# Patient Record
Sex: Female | Born: 1980 | Race: Black or African American | Hispanic: No | Marital: Single | Smoking: Former smoker
Health system: Southern US, Community
[De-identification: ages and names within clinical notes are randomized; demographics above are authoritative.]

## PROBLEM LIST (undated history)

## (undated) DIAGNOSIS — E669 Obesity, unspecified: Secondary | ICD-10-CM

## (undated) DIAGNOSIS — M5416 Radiculopathy, lumbar region: Secondary | ICD-10-CM

## (undated) DIAGNOSIS — F909 Attention-deficit hyperactivity disorder, unspecified type: Secondary | ICD-10-CM

## (undated) DIAGNOSIS — J45909 Unspecified asthma, uncomplicated: Secondary | ICD-10-CM

## (undated) DIAGNOSIS — F1291 Cannabis use, unspecified, in remission: Secondary | ICD-10-CM

## (undated) DIAGNOSIS — F419 Anxiety disorder, unspecified: Secondary | ICD-10-CM

## (undated) DIAGNOSIS — D649 Anemia, unspecified: Secondary | ICD-10-CM

## (undated) DIAGNOSIS — Z8619 Personal history of other infectious and parasitic diseases: Secondary | ICD-10-CM

## (undated) DIAGNOSIS — A6000 Herpesviral infection of urogenital system, unspecified: Secondary | ICD-10-CM

## (undated) DIAGNOSIS — R079 Chest pain, unspecified: Secondary | ICD-10-CM

## (undated) DIAGNOSIS — M48061 Spinal stenosis, lumbar region without neurogenic claudication: Secondary | ICD-10-CM

## (undated) DIAGNOSIS — R0602 Shortness of breath: Secondary | ICD-10-CM

## (undated) HISTORY — PX: FOOT SURGERY: SHX648

---

## 2004-10-15 ENCOUNTER — Emergency Department: Payer: Self-pay | Admitting: General Practice

## 2004-11-07 ENCOUNTER — Emergency Department: Payer: Self-pay | Admitting: Emergency Medicine

## 2004-11-24 ENCOUNTER — Emergency Department: Payer: Self-pay | Admitting: Unknown Physician Specialty

## 2005-03-09 ENCOUNTER — Emergency Department: Payer: Self-pay | Admitting: Emergency Medicine

## 2005-03-11 ENCOUNTER — Emergency Department: Payer: Self-pay | Admitting: General Practice

## 2006-01-31 ENCOUNTER — Emergency Department: Payer: Self-pay | Admitting: Emergency Medicine

## 2006-02-20 ENCOUNTER — Emergency Department: Payer: Self-pay | Admitting: Emergency Medicine

## 2006-05-02 ENCOUNTER — Observation Stay: Payer: Self-pay | Admitting: Obstetrics and Gynecology

## 2006-05-11 ENCOUNTER — Observation Stay: Payer: Self-pay

## 2006-05-20 ENCOUNTER — Observation Stay: Payer: Self-pay

## 2006-09-04 ENCOUNTER — Emergency Department: Payer: Self-pay | Admitting: Emergency Medicine

## 2006-12-22 ENCOUNTER — Other Ambulatory Visit: Payer: Self-pay

## 2006-12-22 ENCOUNTER — Emergency Department: Payer: Self-pay | Admitting: Emergency Medicine

## 2008-01-28 DIAGNOSIS — Z91199 Patient's noncompliance with other medical treatment and regimen due to unspecified reason: Secondary | ICD-10-CM | POA: Insufficient documentation

## 2008-01-28 DIAGNOSIS — Z3009 Encounter for other general counseling and advice on contraception: Secondary | ICD-10-CM | POA: Insufficient documentation

## 2008-08-25 ENCOUNTER — Emergency Department: Payer: Self-pay | Admitting: Emergency Medicine

## 2008-12-27 ENCOUNTER — Emergency Department: Payer: Self-pay | Admitting: Emergency Medicine

## 2009-09-26 ENCOUNTER — Emergency Department: Payer: Self-pay | Admitting: Unknown Physician Specialty

## 2010-07-11 ENCOUNTER — Emergency Department: Payer: Self-pay | Admitting: Emergency Medicine

## 2010-11-02 ENCOUNTER — Emergency Department: Payer: Self-pay | Admitting: *Deleted

## 2011-06-30 ENCOUNTER — Emergency Department: Payer: Self-pay | Admitting: Unknown Physician Specialty

## 2011-06-30 LAB — BASIC METABOLIC PANEL
BUN: 18 mg/dL (ref 7–18)
Co2: 22 mmol/L (ref 21–32)
Creatinine: 0.9 mg/dL (ref 0.60–1.30)
EGFR (Non-African Amer.): >60
Glucose: 108 mg/dL — ABNORMAL HIGH (ref 65–99)
Osmolality: 287 (ref 275–301)
Potassium: 3.6 mmol/L (ref 3.5–5.1)
Sodium: 143 mmol/L (ref 136–145)

## 2011-06-30 LAB — CBC
HCT: 39.1 % (ref 35.0–47.0)
HGB: 13.1 g/dL (ref 12.0–16.0)
MCH: 31.5 pg (ref 26.0–34.0)
MCHC: 33.5 g/dL (ref 32.0–36.0)
Platelet: 207 10*3/uL (ref 150–440)
RBC: 4.16 10*6/uL (ref 3.80–5.20)

## 2011-11-16 ENCOUNTER — Emergency Department: Payer: Self-pay | Admitting: Emergency Medicine

## 2011-11-16 LAB — BASIC METABOLIC PANEL
Calcium, Total: 8.7 mg/dL (ref 8.5–10.1)
Co2: 24 mmol/L (ref 21–32)
Creatinine: 1.17 mg/dL (ref 0.60–1.30)
EGFR (African American): >60
EGFR (Non-African Amer.): >60
Glucose: 118 mg/dL — ABNORMAL HIGH (ref 65–99)
Sodium: 143 mmol/L (ref 136–145)

## 2011-11-16 LAB — CBC
HCT: 40.8 % (ref 35.0–47.0)
HGB: 13.1 g/dL (ref 12.0–16.0)
MCH: 30.4 pg (ref 26.0–34.0)
MCHC: 32.2 g/dL (ref 32.0–36.0)
Platelet: 209 10*3/uL (ref 150–440)
RBC: 4.33 10*6/uL (ref 3.80–5.20)
RDW: 12.5 % (ref 11.5–14.5)

## 2012-03-25 ENCOUNTER — Emergency Department: Payer: Self-pay | Admitting: Emergency Medicine

## 2012-04-03 ENCOUNTER — Emergency Department: Payer: Self-pay | Admitting: Emergency Medicine

## 2012-07-26 ENCOUNTER — Ambulatory Visit: Payer: Self-pay | Admitting: Unknown Physician Specialty

## 2012-07-26 DIAGNOSIS — J4520 Mild intermittent asthma, uncomplicated: Secondary | ICD-10-CM | POA: Insufficient documentation

## 2012-07-26 LAB — CBC WITH DIFFERENTIAL/PLATELET
Basophil #: 0.1 10*3/uL (ref 0.0–0.1)
Eosinophil #: 0.2 10*3/uL (ref 0.0–0.7)
Eosinophil %: 2.6 %
Lymphocyte #: 2.8 10*3/uL (ref 1.0–3.6)
MCHC: 33.3 g/dL (ref 32.0–36.0)
MCV: 94 fL (ref 80–100)
Monocyte %: 9.8 %
Neutrophil %: 51.4 %
RBC: 4.15 10*6/uL (ref 3.80–5.20)
RDW: 12.9 % (ref 11.5–14.5)

## 2012-07-26 LAB — POTASSIUM: Potassium: 3.9 mmol/L (ref 3.5–5.1)

## 2012-07-27 ENCOUNTER — Ambulatory Visit: Payer: Self-pay | Admitting: Podiatry

## 2014-08-15 NOTE — Op Note (Signed)
PATIENT NAME:  Melissa Black, Melissa Black MR#:  [id number] DATE OF BIRTH:  10/03/1980  DATE OF PROCEDURE:  07/27/2012  PREOPERATIVE DIAGNOSIS: Hammertoe with exostosis, right fifth toe.   POSTOPERATIVE DIAGNOSIS: Hammertoe with exostosis, right fifth toe.   PROCEDURE: Arthroplasty, right fifth toe.   SURGEON: Linus Galasodd Chasyn Cinque, DPM  ANESTHESIA: Local MAC.   HEMOSTASIS: Pneumatic tourniquet, right ankle, 250 mmHg.   ESTIMATED BLOOD LOSS: Minimal.   PATHOLOGY: None.   COMPLICATIONS: None apparent.   OPERATIVE INDICATIONS: This is a 34 year old female with a history of a fracture in her right fifth toe. Has developed a painful corn on the toe with bony prominence and elects for surgical intervention for removal.   DESCRIPTION OF PROCEDURE: The patient was taken to the operating room and placed on the table in the supine position. Following satisfactory sedation, the right foot was anesthetized with 5 mL of 0.5% Sensorcaine plain around the base of the fifth toe. A pneumatic tourniquet was applied at the level of the right ankle and the foot was prepped and draped in the usual sterile fashion. The foot was exsanguinated and the tourniquet inflated to 250 mmHg.   Attention was then directed to the dorsal aspect of the right fifth toe where an approximate 1.5 cm linear incision was made coursing proximal to distal over the proximal interphalangeal joint. The incision was deepened via sharp and blunt dissection down to the level of the joint where a transverse tenotomy was performed. The capsular and periosteal tissue was reflected off the head of the proximal phalanx. Using a pneumatic saw, the head was resected and removed in toto. Next, the periosteal tissues were reflected off of the lateral aspect of the middle phalanx. Using a pneumatic saw, the lateral most portion of the middle phalanx was resected. Intraoperative FluoroScan views revealed good reduction of all of the bony prominences. The wound was flushed  with copious amounts of sterile saline and closed using 4-0 Vicryl simple interrupted suture for tendon reapproximation followed by skin closure using 5-0 nylon simple interrupted sutures. Xeroform and a sterile gauze bandage was applied. The tourniquet was released and blood flow noted to return immediately to the right foot in all digits. The patient tolerated the procedure and anesthesia well and was transported to the PAC-U with vital signs stable and in good condition.  ____________________________ Linus Galasodd Satina Jerrell, DPM tc:sb D: 07/27/2012 10:44:32 ET T: 07/27/2012 11:03:49 ET JOB#: [id number]  cc: Linus Galasodd Kaysi Ourada, DPM, <Dictator> Jt Brabec DPM ELECTRONICALLY SIGNED 07/28/2012 10:55

## 2015-04-02 ENCOUNTER — Emergency Department
Admission: EM | Admit: 2015-04-02 | Discharge: 2015-04-02 | Disposition: A | Discharge status: Home / Self Care | Payer: No Typology Code available for payment source | Attending: Emergency Medicine | Admitting: Emergency Medicine

## 2015-04-02 ENCOUNTER — Encounter: Payer: Self-pay | Admitting: Emergency Medicine

## 2015-04-02 DIAGNOSIS — Y92410 Unspecified street and highway as the place of occurrence of the external cause: Secondary | ICD-10-CM | POA: Insufficient documentation

## 2015-04-02 DIAGNOSIS — Z880 Allergy status to penicillin: Secondary | ICD-10-CM | POA: Insufficient documentation

## 2015-04-02 DIAGNOSIS — Y998 Other external cause status: Secondary | ICD-10-CM | POA: Insufficient documentation

## 2015-04-02 DIAGNOSIS — S0990XA Unspecified injury of head, initial encounter: Secondary | ICD-10-CM | POA: Insufficient documentation

## 2015-04-02 DIAGNOSIS — M62838 Other muscle spasm: Secondary | ICD-10-CM

## 2015-04-02 DIAGNOSIS — Z87891 Personal history of nicotine dependence: Secondary | ICD-10-CM | POA: Diagnosis not present

## 2015-04-02 DIAGNOSIS — Y9389 Activity, other specified: Secondary | ICD-10-CM | POA: Diagnosis not present

## 2015-04-02 DIAGNOSIS — S0083XA Contusion of other part of head, initial encounter: Secondary | ICD-10-CM | POA: Insufficient documentation

## 2015-04-02 DIAGNOSIS — S39012A Strain of muscle, fascia and tendon of lower back, initial encounter: Secondary | ICD-10-CM | POA: Insufficient documentation

## 2015-04-02 DIAGNOSIS — S3992XA Unspecified injury of lower back, initial encounter: Secondary | ICD-10-CM | POA: Diagnosis present

## 2015-04-02 MED ORDER — DIAZEPAM 2 MG PO TABS: 2.0000 mg | ORAL_TABLET | Freq: Three times a day (TID) | ORAL | Status: DC | PRN

## 2015-04-02 MED ORDER — MELOXICAM 15 MG PO TABS: 15.0000 mg | ORAL_TABLET | Freq: Every day | ORAL | Status: DC
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
## 2015-09-03 ENCOUNTER — Encounter: Payer: Self-pay | Admitting: Emergency Medicine

## 2015-09-03 DIAGNOSIS — Z87891 Personal history of nicotine dependence: Secondary | ICD-10-CM | POA: Diagnosis not present

## 2015-09-03 DIAGNOSIS — R0789 Other chest pain: Secondary | ICD-10-CM | POA: Insufficient documentation

## 2015-09-03 DIAGNOSIS — J45909 Unspecified asthma, uncomplicated: Secondary | ICD-10-CM | POA: Insufficient documentation

## 2015-09-03 DIAGNOSIS — Z79899 Other long term (current) drug therapy: Secondary | ICD-10-CM | POA: Insufficient documentation

## 2015-09-03 NOTE — ED Notes (Signed)
Pt to rm 13 via EMS from home.  EMS reports pt c/o intermittent cp x 1 week, described as heaviness/cramping, located mid chest.  Pt hx asthma.  Pt NAD upon arrival, respirations equal and unlabored, skin warm and dry.

## 2015-09-04 ENCOUNTER — Emergency Department
Admission: EM | Admit: 2015-09-04 | Discharge: 2015-09-04 | Disposition: A | Discharge status: Home / Self Care | Payer: Medicaid Other | Attending: Emergency Medicine | Admitting: Emergency Medicine

## 2015-09-04 ENCOUNTER — Emergency Department: Payer: Medicaid Other

## 2015-09-04 DIAGNOSIS — R079 Chest pain, unspecified: Secondary | ICD-10-CM

## 2015-09-04 HISTORY — DX: Unspecified asthma, uncomplicated: J45.909

## 2015-09-04 LAB — BASIC METABOLIC PANEL
Anion gap: 4 — ABNORMAL LOW (ref 5–15)
BUN: 13 mg/dL (ref 6–20)
CHLORIDE: 108 mmol/L (ref 101–111)
CO2: 26 mmol/L (ref 22–32)
CREATININE: 0.77 mg/dL (ref 0.44–1.00)
Calcium: 8.9 mg/dL (ref 8.9–10.3)
Glucose, Bld: 121 mg/dL — ABNORMAL HIGH (ref 65–99)
POTASSIUM: 3.8 mmol/L (ref 3.5–5.1)
SODIUM: 138 mmol/L (ref 135–145)

## 2015-09-04 LAB — CBC
HCT: 37 % (ref 35.0–47.0)
Hemoglobin: 12.8 g/dL (ref 12.0–16.0)
MCH: 31.1 pg (ref 26.0–34.0)
MCHC: 34.7 g/dL (ref 32.0–36.0)
MCV: 89.8 fL (ref 80.0–100.0)
PLATELETS: 243 10*3/uL (ref 150–440)
RBC: 4.12 MIL/uL (ref 3.80–5.20)
RDW: 13.1 % (ref 11.5–14.5)
WBC: 8.7 10*3/uL (ref 3.6–11.0)

## 2015-09-04 LAB — FIBRIN DERIVATIVES D-DIMER (ARMC ONLY): FIBRIN DERIVATIVES D-DIMER (ARMC): 220 (ref 0–499)

## 2015-09-04 LAB — TROPONIN I: Troponin I: <0.03 ng/mL (ref <0.031)

## 2015-09-04 MED ORDER — LORAZEPAM 1 MG PO TABS
1.0000 mg | ORAL_TABLET | Freq: Once | ORAL | Status: AC
  Administered 2015-09-04: 1 mg via ORAL

## 2015-09-04 MED ORDER — LORAZEPAM 1 MG PO TABS
ORAL_TABLET | ORAL | Status: AC
  Administered 2015-09-04: 1 mg via ORAL
  Filled 2015-09-04: qty 1

## 2015-09-04 NOTE — Discharge Instructions (Signed)

## 2015-09-04 NOTE — ED Notes (Signed)
Pt reports SOB to tech who informed this nurse.  Pt assessed, lung sounds clear, normal rate of 20, pt c/o heaviness 6/10, states it was worse a couple minutes ago.  VS WDL.  Dr. Manson PasseyBrown notified

## 2015-09-04 NOTE — ED Provider Notes (Signed)
[facility] Emergency Department Provider Note  ____________________________________________  Time seen: 11:55 PM  I have reviewed the triage vital signs and the nursing notes.   HISTORY  Chief Complaint Chest Pain      HPI Melissa Black is a 35 y.o. female presents with intermittent central chest discomfort times one week that is described as heaviness and cramping. Patient denies any dyspnea no nausea vomiting diaphoresis. Patient denies any history of myocardial infarctions personal or family. Patient current chest discomfort described as 6 out of 10.    Past Medical History  Diagnosis Date  . Asthma     There are no active problems to display for this patient.   Past Surgical History  Procedure Laterality Date  . Cesarean section      Current Outpatient Rx  Name  Route  Sig  Dispense  Refill  . DULoxetine HCl (CYMBALTA PO)   Oral   Take 1 tablet by mouth daily.           Allergies Codeine; Penicillins; and Benadryl  History reviewed. No pertinent family history.  Social History Social History  Substance Use Topics  . Smoking status: Former Games developer  . Smokeless tobacco: None  . Alcohol Use: No    Review of Systems  Constitutional: Negative for fever. Eyes: Negative for visual changes. ENT: Negative for sore throat. Cardiovascular: Positive for chest pain. Respiratory: Negative for shortness of breath. Gastrointestinal: Negative for abdominal pain, vomiting and diarrhea. Genitourinary: Negative for dysuria. Musculoskeletal: Negative for back pain. Skin: Negative for rash. Neurological: Negative for headaches, focal weakness or numbness.   10-point ROS otherwise negative.  ____________________________________________   PHYSICAL EXAM:  VITAL SIGNS: ED Triage Vitals  Enc Vitals Group     BP 09/03/15 2356 139/71 mmHg     Pulse Rate 09/03/15 2356 72     Resp 09/03/15 2356 12     Temp 09/03/15 2356 98.3 F  (36.8 C)     Temp Source 09/03/15 2356 Oral     SpO2 09/03/15 2356 99 %     Weight 09/03/15 2356 194 lb (87.998 kg)     Height 09/03/15 2356  (1.702 m)     Head Cir --      Peak Flow --      Pain Score 09/03/15 2358 6     Pain Loc --      Pain Edu? --      Excl. in GC? --     Constitutional: Alert and oriented. Well appearing and in no distress. Eyes: Conjunctivae are normal. PERRL. Normal extraocular movements. ENT   Head: Normocephalic and atraumatic.   Nose: No congestion/rhinnorhea.   Mouth/Throat: Mucous membranes are moist.   Neck: No stridor. Hematological/Lymphatic/Immunilogical: No cervical lymphadenopathy. Cardiovascular: Normal rate, regular rhythm. Normal and symmetric distal pulses are present in all extremities. No murmurs, rubs, or gallops. Respiratory: Normal respiratory effort without tachypnea nor retractions. Breath sounds are clear and equal bilaterally. No wheezes/rales/rhonchi. Gastrointestinal: Soft and nontender. No distention. There is no CVA tenderness. Genitourinary: deferred Musculoskeletal: Nontender with normal range of motion in all extremities. No joint effusions.  No lower extremity tenderness nor edema. Neurologic:  Normal speech and language. No gross focal neurologic deficits are appreciated. Speech is normal.  Skin:  Skin is warm, dry and intact. No rash noted. Psychiatric: Mood and affect are normal. Speech and behavior are normal. Patient exhibits appropriate insight and judgment.  ____________________________________________    LABS (pertinent positives/negatives)  Labs  Reviewed  BASIC METABOLIC PANEL - Abnormal; Notable for the following:    Glucose, Bld 121 (*)    Anion gap 4 (*)    All other components within normal limits  CBC  TROPONIN I  FIBRIN DERIVATIVES D-DIMER ([facility] ONLY)  TROPONIN I     ____________________________________________   EKG  ED ECG REPORT I, [place] N BROWN, the attending physician,  personally viewed and interpreted this ECG.   Date: 09/04/2015  EKG Time: 11:59 PM  Rate: 73  Rhythm: Normal sinus rhythm  Axis: Normal  Intervals: Normal  ST&T Change: None   ____________________________________________    RADIOLOGY     DG Chest Portable 1 View (Final result) Result time: 09/04/15 00:24:51   Final result by Rad Results In Interface (09/04/15 00:24:51)   Narrative:   CLINICAL DATA: Intermittent chest pain for 1 week.  EXAM: PORTABLE CHEST 1 VIEW  COMPARISON: 11/16/2011  FINDINGS: A single AP portable view of the chest demonstrates no focal airspace consolidation or alveolar edema. The lungs are grossly clear. There is no large effusion or pneumothorax. Cardiac and mediastinal contours appear unremarkable.  IMPRESSION: No active disease.   Electronically Signed By: Ellery Plunkaniel R Mitchell M.D. On: 09/04/2015 00:24     INITIAL IMPRESSION / ASSESSMENT AND PLAN / ED COURSE  Pertinent labs & imaging results that were available during my care of the patient were reviewed by me and considered in my medical decision making (see chart for details).  Considered cardiac etiology of the patient's chest pain evaluated for myocardial infarction EKG revealed no ST segment elevation or depression, troponin negative 2. Consider possibility of a pulmonary emboli in a low probability patient d-dimer negative. We'll refer the patient to cardiologist on-call for further outpatient evaluation.  ____________________________________________   FINAL CLINICAL IMPRESSION(S) / ED DIAGNOSES  Final diagnoses:  Chest pain, unspecified chest pain type      Darci Currentandolph N Brown, MD 09/04/15 [phone]

## 2015-09-14 DIAGNOSIS — Z87891 Personal history of nicotine dependence: Secondary | ICD-10-CM | POA: Insufficient documentation

## 2015-09-14 DIAGNOSIS — R0600 Dyspnea, unspecified: Secondary | ICD-10-CM | POA: Diagnosis present

## 2015-09-14 DIAGNOSIS — J45909 Unspecified asthma, uncomplicated: Secondary | ICD-10-CM | POA: Insufficient documentation

## 2015-09-14 DIAGNOSIS — Z79899 Other long term (current) drug therapy: Secondary | ICD-10-CM | POA: Insufficient documentation

## 2015-09-14 DIAGNOSIS — R079 Chest pain, unspecified: Secondary | ICD-10-CM | POA: Insufficient documentation

## 2015-09-14 DIAGNOSIS — F419 Anxiety disorder, unspecified: Secondary | ICD-10-CM | POA: Diagnosis not present

## 2015-09-14 LAB — CBC
HCT: 36.2 % (ref 35.0–47.0)
HEMOGLOBIN: 12.5 g/dL (ref 12.0–16.0)
MCH: 30.9 pg (ref 26.0–34.0)
MCHC: 34.6 g/dL (ref 32.0–36.0)
MCV: 89.3 fL (ref 80.0–100.0)
Platelets: 222 10*3/uL (ref 150–440)
RBC: 4.05 MIL/uL (ref 3.80–5.20)
RDW: 12.8 % (ref 11.5–14.5)
WBC: 8.9 10*3/uL (ref 3.6–11.0)

## 2015-09-14 LAB — BASIC METABOLIC PANEL
Anion gap: 8 (ref 5–15)
BUN: 13 mg/dL (ref 6–20)
CHLORIDE: 108 mmol/L (ref 101–111)
CO2: 22 mmol/L (ref 22–32)
Calcium: 9 mg/dL (ref 8.9–10.3)
Creatinine, Ser: 0.75 mg/dL (ref 0.44–1.00)
GFR calc Af Amer: >60 mL/min (ref >60)
GFR calc non Af Amer: >60 mL/min (ref >60)
Glucose, Bld: 92 mg/dL (ref 65–99)
POTASSIUM: 3.2 mmol/L — AB (ref 3.5–5.1)
SODIUM: 138 mmol/L (ref 135–145)

## 2015-09-14 LAB — TROPONIN I

## 2015-09-14 NOTE — ED Notes (Signed)
Pt in with co shob for 2 weeks has been here before for the same and told it was anxiety. Also co left sided chest pain and decreased appetite.

## 2015-09-15 ENCOUNTER — Emergency Department
Admission: EM | Admit: 2015-09-15 | Discharge: 2015-09-15 | Disposition: A | Discharge status: Home / Self Care | Payer: Medicaid Other | Attending: Emergency Medicine | Admitting: Emergency Medicine

## 2015-09-15 ENCOUNTER — Emergency Department: Payer: Medicaid Other

## 2015-09-15 DIAGNOSIS — R079 Chest pain, unspecified: Secondary | ICD-10-CM

## 2015-09-15 DIAGNOSIS — R0600 Dyspnea, unspecified: Secondary | ICD-10-CM

## 2015-09-15 DIAGNOSIS — F419 Anxiety disorder, unspecified: Secondary | ICD-10-CM

## 2015-09-15 LAB — FIBRIN DERIVATIVES D-DIMER (ARMC ONLY): FIBRIN DERIVATIVES D-DIMER (ARMC): 198 (ref 0–499)

## 2015-09-15 LAB — TROPONIN I

## 2015-09-15 LAB — BRAIN NATRIURETIC PEPTIDE: B NATRIURETIC PEPTIDE 5: 13 pg/mL (ref 0.0–100.0)

## 2015-09-15 MED ORDER — LORAZEPAM 0.5 MG PO TABS: 0.5000 mg | ORAL_TABLET | Freq: Three times a day (TID) | ORAL | Status: AC | PRN

## 2015-09-15 MED ORDER — LORAZEPAM 1 MG PO TABS
1.0000 mg | ORAL_TABLET | Freq: Once | ORAL | Status: AC
Start: 2015-09-15 — End: 2015-09-15
  Administered 2015-09-15: 1 mg via ORAL
  Filled 2015-09-15: qty 1

## 2015-09-15 MED ORDER — HYDROXYZINE HCL 25 MG PO TABS
25.0000 mg | ORAL_TABLET | Freq: Once | ORAL | Status: AC
  Administered 2015-09-15: 25 mg via ORAL
  Filled 2015-09-15: qty 1

## 2015-09-15 MED ORDER — SODIUM CHLORIDE 0.9 % IV BOLUS (SEPSIS)
1000.0000 mL | Freq: Once | INTRAVENOUS | Status: AC
  Administered 2015-09-15: 1000 mL via INTRAVENOUS

## 2015-09-15 NOTE — ED Provider Notes (Signed)
[facility] Emergency Department Provider Note   ____________________________________________  Time seen: Approximately 516 AM  I have reviewed the triage vital signs and the nursing notes.   HISTORY  Chief Complaint Shortness of Breath    HPI Melissa Black is a 35 y.o. female who comes into the hospital today with difficulty breathing. The patient reports that her symptoms started 2 weeks ago. She was here last week and was told her they couldn't find anything wrong. She reports that she's having some chest pain and can't sleep and can't rest. She also reports is difficult to breathe. She reports that she couldn't get in to see her Dr. Norma Fredricksonoledo this week so she decided to come in tonight. She reports that the symptoms seem to be worse especially at night. The patient is very tearful and gets very worked up and agitated during the history. She reports that nothing seems to make it worse. She's been trying to take ibuprofen as her chest has been sore but she says is not helping. The patient rates her pain a 7 out of 10 in intensity. She tried her inhaler but she reports that it opens her lungs up too much and it makes her symptoms worse. The patient also reports that it hurts when she breathes. The patient is unsure what going on and she says she is very scared. The patient is here for evaluation.   Past Medical History  Diagnosis Date  . Asthma     There are no active problems to display for this patient.   Past Surgical History  Procedure Laterality Date  . Cesarean section      Current Outpatient Rx  Name  Route  Sig  Dispense  Refill  . albuterol (PROVENTIL HFA;VENTOLIN HFA) 108 (90 Base) MCG/ACT inhaler   Inhalation   Inhale 1-2 puffs into the lungs every 6 (six) hours as needed for wheezing or shortness of breath.         . DULoxetine (CYMBALTA) 60 MG capsule   Oral   Take 60 mg by mouth 2 (two) times daily as needed (for  depression.).         Marland Kitchen. LORazepam (ATIVAN) 0.5 MG tablet   Oral   Take 1 tablet (0.5 mg total) by mouth every 8 (eight) hours as needed for anxiety.   15 tablet   0     Allergies Codeine; Penicillins; and Benadryl  No family history on file.  Social History Social History  Substance Use Topics  . Smoking status: Former Games developermoker  . Smokeless tobacco: Not on file  . Alcohol Use: No    Review of Systems Constitutional: No fever/chills Eyes: No visual changes. ENT: No sore throat. Cardiovascular:  chest pain. Respiratory:  shortness of breath. Gastrointestinal: No abdominal pain.  No nausea, no vomiting.  No diarrhea.  No constipation. Genitourinary: Negative for dysuria. Musculoskeletal: Negative for back pain. Skin: Negative for rash. Neurological: Negative for headaches, focal weakness or numbness.  10-point ROS otherwise negative.  ____________________________________________   PHYSICAL EXAM:  VITAL SIGNS: ED Triage Vitals  Enc Vitals Group     BP 09/14/15 2310 125/69 mmHg     Pulse Rate 09/14/15 2310 70     Resp 09/15/15 0417 16     Temp 09/14/15 2310 97.9 F (36.6 C)     Temp Source 09/14/15 2310 Oral     SpO2 09/14/15 2310 100 %     Weight 09/14/15 2300 195 lb (88.451 kg)  Height 09/14/15 2300  (1.702 m)     Head Cir --      Peak Flow --      Pain Score 09/14/15 2300 7     Pain Loc --      Pain Edu? --      Excl. in GC? --     Constitutional: Alert and oriented. Patient tearful crying on exam and during history. Patient hyperventilating. Eyes: Conjunctivae are normal. PERRL. EOMI. Head: Atraumatic. Nose: No congestion/rhinnorhea. Mouth/Throat: Mucous membranes are moist.  Oropharynx non-erythematous. Cardiovascular: Normal rate, regular rhythm. Grossly normal heart sounds.  Good peripheral circulation. Respiratory: Patient hyperventilating.  No retractions. Lungs CTAB. Gastrointestinal: Soft and nontender. No distention. Positive bowel  sounds Musculoskeletal: No lower extremity tenderness nor edema.   Neurologic:  Normal speech and language.  Skin:  Skin is warm, dry and intact.Marland Kitchen Psychiatric: Mood and affect are normal.   ____________________________________________   LABS (all labs ordered are listed, but only abnormal results are displayed)  Labs Reviewed  BASIC METABOLIC PANEL - Abnormal; Notable for the following:    Potassium 3.2 (*)    All other components within normal limits  CBC  TROPONIN I  TROPONIN I  FIBRIN DERIVATIVES D-DIMER ([facility] ONLY)  BRAIN NATRIURETIC PEPTIDE   ____________________________________________  EKG  ED ECG REPORT I, Rebecka Apley, the attending physician, personally viewed and interpreted this ECG.   Date: 09/14/2015  EKG Time: 2304  Rate: 75  Rhythm: normal sinus rhythm  Axis: normal  Intervals:none  ST&T Change: normal  ____________________________________________  RADIOLOGY  Chest x-ray: No acute pulmonary process ____________________________________________   PROCEDURES  Procedure(s) performed: None  Critical Care performed: No  ____________________________________________   INITIAL IMPRESSION / ASSESSMENT AND PLAN / ED COURSE  Pertinent labs & imaging results that were available during my care of the patient were reviewed by me and considered in my medical decision making (see chart for details).  This is a 35 year old female who comes into the hospital today with some shortness of breath. The patient reports that she's not sure exactly what's going on and she is having some chest pain. The patient's blood work is unremarkable. I initially gave the patient a liter of normal saline and Vistaril because as I am talking to her she gets more agitated and her heart rate increases. When she calms down her heart rate improves. I feel that the patient's symptoms may be due to anxiety. I did give the patient a dose of Ativan as well and after the medication  she was sitting up without any crying and appeared well without discomfort. I spent the patient that she needs to follow-up with her doctor and I will give her some medicine for anxiety for home. The patient be discharged home to follow-up with her primary care physician. ____________________________________________   FINAL CLINICAL IMPRESSION(S) / ED DIAGNOSES  Final diagnoses:  Dyspnea  Anxiety  Chest pain, unspecified chest pain type      NEW MEDICATIONS STARTED DURING THIS VISIT:  New Prescriptions   LORAZEPAM (ATIVAN) 0.5 MG TABLET    Take 1 tablet (0.5 mg total) by mouth every 8 (eight) hours as needed for anxiety.     Note:  This document was prepared using Dragon voice recognition software and may include unintentional dictation errors.    Rebecka Apley, MD 09/15/15 [phone]

## 2015-09-15 NOTE — ED Notes (Signed)
Pt discharged home after verbalizing understanding of discharge instructions; nad noted. 

## 2015-09-15 NOTE — ED Notes (Signed)
Pt awakened and asked for assistance to toilet. Pt states that she is SOB upon awakening. O2 sat 100%; RR 40, decreased to 30 within a minute. NAD noted.

## 2015-09-15 NOTE — Discharge Instructions (Signed)
Nonspecific Chest Pain  Chest pain can be caused by many different conditions. There is always a chance that your pain could be related to something serious, such as a heart attack or a blood clot in your lungs. Chest pain can also be caused by conditions that are not life-threatening. If you have chest pain, it is very important to follow up with your health care provider. CAUSES  Chest pain can be caused by:  Heartburn.  Pneumonia or bronchitis.  Anxiety or stress.  Inflammation around your heart (pericarditis) or lung (pleuritis or pleurisy).  A blood clot in your lung.  A collapsed lung (pneumothorax). It can develop suddenly on its own (spontaneous pneumothorax) or from trauma to the chest.  Shingles infection (varicella-zoster virus).  Heart attack.  Damage to the bones, muscles, and cartilage that make up your chest wall. This can include:  Bruised bones due to injury.  Strained muscles or cartilage due to frequent or repeated coughing or overwork.  Fracture to one or more ribs.  Sore cartilage due to inflammation (costochondritis). RISK FACTORS  Risk factors for chest pain may include:  Activities that increase your risk for trauma or injury to your chest.  Respiratory infections or conditions that cause frequent coughing.  Medical conditions or overeating that can cause heartburn.  Heart disease or family history of heart disease.  Conditions or health behaviors that increase your risk of developing a blood clot.  Having had chicken pox (varicella zoster). SIGNS AND SYMPTOMS Chest pain can feel like:  Burning or tingling on the surface of your chest or deep in your chest.  Crushing, pressure, aching, or squeezing pain.  Dull or sharp pain that is worse when you move, cough, or take a deep breath.  Pain that is also felt in your back, neck, shoulder, or arm, or pain that spreads to any of these areas. Your chest pain may come and go, or it may stay  constant. DIAGNOSIS Lab tests or other studies may be needed to find the cause of your pain. Your health care provider may have you take a test called an ambulatory ECG (electrocardiogram). An ECG records your heartbeat patterns at the time the test is performed. You may also have other tests, such as:  Transthoracic echocardiogram (TTE). During echocardiography, sound waves are used to create a picture of all of the heart structures and to look at how blood flows through your heart.  Transesophageal echocardiogram (TEE).This is a more advanced imaging test that obtains images from inside your body. It allows your health care provider to see your heart in finer detail.  Cardiac monitoring. This allows your health care provider to monitor your heart rate and rhythm in real time.  Holter monitor. This is a portable device that records your heartbeat and can help to diagnose abnormal heartbeats. It allows your health care provider to track your heart activity for several days, if needed.  Stress tests. These can be done through exercise or by taking medicine that makes your heart beat more quickly.  Blood tests.  Imaging tests. TREATMENT  Your treatment depends on what is causing your chest pain. Treatment may include:  Medicines. These may include:  Acid blockers for heartburn.  Anti-inflammatory medicine.  Pain medicine for inflammatory conditions.  Antibiotic medicine, if an infection is present.  Medicines to dissolve blood clots.  Medicines to treat coronary artery disease.  Supportive care for conditions that do not require medicines. This may include:  Resting.  Applying heat  or cold packs to injured areas.  Limiting activities until pain decreases. HOME CARE INSTRUCTIONS  If you were prescribed an antibiotic medicine, finish it all even if you start to feel better.  Avoid any activities that bring on chest pain.  Do not use any tobacco products, including  cigarettes, chewing tobacco, or electronic cigarettes. If you need help quitting, ask your health care provider.  Do not drink alcohol.  Take medicines only as directed by your health care provider.  Keep all follow-up visits as directed by your health care provider. This is important. This includes any further testing if your chest pain does not go away.  If heartburn is the cause for your chest pain, you may be told to keep your head raised (elevated) while sleeping. This reduces the chance that acid will go from your stomach into your esophagus.  Make lifestyle changes as directed by your health care provider. These may include:  Getting regular exercise. Ask your health care provider to suggest some activities that are safe for you.  Eating a heart-healthy diet. A registered dietitian can help you to learn healthy eating options.  Maintaining a healthy weight.  Managing diabetes, if necessary.  Reducing stress. SEEK MEDICAL CARE IF:  Your chest pain does not go away after treatment.  You have a rash with blisters on your chest.  You have a fever. SEEK IMMEDIATE MEDICAL CARE IF:   Your chest pain is worse.  You have an increasing cough, or you cough up blood.  You have severe abdominal pain.  You have severe weakness.  You faint.  You have chills.  You have sudden, unexplained chest discomfort.  You have sudden, unexplained discomfort in your arms, back, neck, or jaw.  You have shortness of breath at any time.  You suddenly start to sweat, or your skin gets clammy.  You feel nauseous or you vomit.  You suddenly feel light-headed or dizzy.  Your heart begins to beat quickly, or it feels like it is skipping beats. These symptoms may represent a serious problem that is an emergency. Do not wait to see if the symptoms will go away. Get medical help right away. Call your local emergency services (911 in the [place] Do not drive yourself to the hospital.   This  information is not intended to replace advice given to you by your health care provider. Make sure you discuss any questions you have with your health care provider.   Document Released: 01/19/2005 Document Revised: 05/02/2014 Document Reviewed: 11/15/2013 Elsevier Interactive Patient Education 2016 Elsevier Inc.  Hyperventilation Hyperventilation is breathing that is deeper and more rapid than normal. It is usually associated with panic and anxiety. Hyperventilation can make you feel breathless. It is sometimes called overbreathing. Breathing out too much causes a decrease in the amount of carbon dioxide gas in the blood. This leads to tingling and numbness in the hands, feet, and around the mouth. If this continues, your fingers, hands, and toes may begin to spasm. Hyperventilation usually lasts 20-30 minutes and can be associated with other symptoms of panic and anxiety, including:   Chest pains or tightness.  A pounding or irregular, racing heartbeat (palpitations).  Dizziness.  Lightheadedness.  Dry mouth.  Weakness.  Confusion.  Sleep disturbance. CAUSES  Sudden onset (acute) hyperventilation is usually triggered by acute stress, anxiety, or emotional upset. Long-term (chronic) and recurring hyperventilation can occur with chronic lung problems, such emphysema or asthma. Other causes include:   Nervousness.  Stress.  Stimulant, drug, or alcohol use.  Lung disease.  Infections, such as pneumonia.  Heart problems.  Severe pain.  Waking from a bad dream.  Pregnancy.  Bleeding. HOME CARE INSTRUCTIONS  Learn and use breathing exercises that help you breathe from your diaphragm and abdomen.  Practice relaxation techniques to reduce stress, such as visualization, meditation, and muscle release.  During an attack, try breathing into a paper bag. This changes the carbon dioxide level and slows down breathing. SEEK IMMEDIATE MEDICAL CARE IF:  Your hyperventilation  continues or gets worse. MAKE SURE YOU:  Understand these instructions.  Will watch your condition.  Will get help right away if you are not doing well or get worse.   This information is not intended to replace advice given to you by your health care provider. Make sure you discuss any questions you have with your health care provider.   Document Released: 04/08/2000 Document Revised: 10/11/2011 Document Reviewed: 07/21/2011 Elsevier Interactive Patient Education 2016 [employer]  Shortness of Breath Shortness of breath means you have trouble breathing. Shortness of breath needs medical care right away. HOME CARE   Do not smoke.  Avoid being around chemicals or things (paint fumes, dust) that may bother your breathing.  Rest as needed. Slowly begin your normal activities.  Only take medicines as told by your doctor.  Keep all doctor visits as told. GET HELP RIGHT AWAY IF:   Your shortness of breath gets worse.  You feel lightheaded, pass out (faint), or have a cough that is not helped by medicine.  You cough up blood.  You have pain with breathing.  You have pain in your chest, arms, shoulders, or belly (abdomen).  You have a fever.  You cannot walk up stairs or exercise the way you normally do.  You do not get better in the time expected.  You have a hard time doing normal activities even with rest.  You have problems with your medicines.  You have any new symptoms. MAKE SURE YOU:  Understand these instructions.  Will watch your condition.  Will get help right away if you are not doing well or get worse.   This information is not intended to replace advice given to you by your health care provider. Make sure you discuss any questions you have with your health care provider.   Document Released: 09/28/2007 Document Revised: 04/16/2013 Document Reviewed: 06/27/2011 Elsevier Interactive Patient Education 2016 Elsevier Inc.  Panic Attacks Panic  attacks are sudden, short feelings of great fear or discomfort. You may have them for no reason when you are relaxed, when you are uneasy (anxious), or when you are sleeping.  HOME CARE  Take all your medicines as told.  Check with your doctor before starting new medicines.  Keep all doctor visits. GET HELP IF:  You are not able to take your medicines as told.  Your symptoms do not get better.  Your symptoms get worse. GET HELP RIGHT AWAY IF:  Your attacks seem different than your normal attacks.  You have thoughts about hurting yourself or others.  You take panic attack medicine and you have a side effect. MAKE SURE YOU:  Understand these instructions.  Will watch your condition.  Will get help right away if you are not doing well or get worse.   This information is not intended to replace advice given to you by your health care provider. Make sure you discuss any questions you have with your health care provider.  Document Released: 05/14/2010 Document Revised: 01/30/2013 Document Reviewed: 11/23/2012 Elsevier Interactive Patient Education [employer]

## 2015-12-30 ENCOUNTER — Emergency Department
Admission: EM | Admit: 2015-12-30 | Discharge: 2015-12-30 | Disposition: A | Discharge status: Home / Self Care | Payer: Medicaid Other | Attending: Emergency Medicine | Admitting: Emergency Medicine

## 2015-12-30 ENCOUNTER — Encounter: Payer: Self-pay | Admitting: Emergency Medicine

## 2015-12-30 DIAGNOSIS — K029 Dental caries, unspecified: Secondary | ICD-10-CM

## 2015-12-30 DIAGNOSIS — K0889 Other specified disorders of teeth and supporting structures: Secondary | ICD-10-CM

## 2015-12-30 DIAGNOSIS — J45909 Unspecified asthma, uncomplicated: Secondary | ICD-10-CM | POA: Diagnosis not present

## 2015-12-30 DIAGNOSIS — Z79899 Other long term (current) drug therapy: Secondary | ICD-10-CM | POA: Insufficient documentation

## 2015-12-30 DIAGNOSIS — Z87891 Personal history of nicotine dependence: Secondary | ICD-10-CM | POA: Insufficient documentation

## 2015-12-30 MED ORDER — CLINDAMYCIN HCL 300 MG PO CAPS: 300.0000 mg | ORAL_CAPSULE | Freq: Three times a day (TID) | ORAL | 0 refills | Status: AC

## 2015-12-30 MED ORDER — NABUMETONE 750 MG PO TABS: 750.0000 mg | ORAL_TABLET | Freq: Two times a day (BID) | ORAL | 0 refills | Status: DC

## 2015-12-30 MED ORDER — LIDOCAINE VISCOUS 2 % MT SOLN
15.0000 mL | Freq: Once | OROMUCOSAL | Status: AC
  Administered 2015-12-30: 15 mL via OROMUCOSAL
  Filled 2015-12-30: qty 15

## 2015-12-30 NOTE — ED Notes (Signed)
Pt. States ongoing toothache for the past week.  Pt. States pain to lt. Upper molar.  Pt. States OTC medication has given only some relief.  Pt. States dental appointment made for next week.  Pt. Denies broken tooth.

## 2015-12-30 NOTE — Discharge Instructions (Signed)
Your exam does not show an obvious dental infection. You are being treated for a possible dental infection due to underlying caries. Take the antibiotic as directed. Take the pain medicine as needed. Follow-up with your dental provider as planned next week. Rinse with the viscous lidocaine as directed.

## 2015-12-30 NOTE — ED Notes (Signed)
AAOx3.  Skin warm and dry.  NAD 

## 2015-12-30 NOTE — ED Triage Notes (Signed)
Pt reports toothache to left side of mouth, pt unsure if top or bottom. Pt reports taking ibuprofen at home about 5 pm tonight without relief. No broken teeth noted. Pt alert and oriented. Pt reports has dentist appt 9/14 but reports pain is intense.

## 2016-01-01 NOTE — ED Provider Notes (Signed)
[facility] Emergency Department Provider Note ____________________________________________  Time seen: 2040  I have reviewed the triage vital signs and the nursing notes.  HISTORY  Chief Complaint  Dental Pain  HPI Melissa Black is a 35 y.o. female presents to the ED for evaluation of pain to the left side of her mouth and she assumes due to dental pain. The patient however is unsure if the pain is coming from the upper or lower dentition. She reports taken from home about 5 PM this evening without relief. She denies any trauma, recent dental procedure, broken tooth, or cavity. She claims have an appointment scheduled for 9/14, but reports pain is severe at this time. She is without any interim fevers, chills, sweats, facial swelling, or purulent drainage.  Past Medical History:  Diagnosis Date  . Asthma    There are no active problems to display for this patient.  Past Surgical History:  Procedure Laterality Date  . CESAREAN SECTION      Prior to Admission medications   Medication Sig Start Date End Date Taking? Authorizing Provider  albuterol (PROVENTIL HFA;VENTOLIN HFA) 108 (90 Base) MCG/ACT inhaler Inhale 1-2 puffs into the lungs every 6 (six) hours as needed for wheezing or shortness of breath.    Historical Provider, MD  clindamycin (CLEOCIN) 300 MG capsule Take 1 capsule (300 mg total) by mouth 3 (three) times daily. 12/30/15 01/09/16  Ednah Hammock V Bacon Ayden Apodaca, PA-C  DULoxetine (CYMBALTA) 60 MG capsule Take 60 mg by mouth 2 (two) times daily as needed (for depression.).    Historical Provider, MD  LORazepam (ATIVAN) 0.5 MG tablet Take 1 tablet (0.5 mg total) by mouth every 8 (eight) hours as needed for anxiety. 09/15/15 09/14/16  Rebecka Apley, MD  nabumetone (RELAFEN) 750 MG tablet Take 1 tablet (750 mg total) by mouth 2 (two) times daily. 12/30/15   Kristen Bushway V Bacon Farha Dano, PA-C    Allergies Codeine; Penicillins; and Benadryl  [diphenhydramine]  No family history on file.  Social History Social History  Substance Use Topics  . Smoking status: Former Games developer  . Smokeless tobacco: Never Used  . Alcohol use No   Review of Systems  Constitutional: Negative for fever. Eyes: Negative for visual changes. ENT: Negative for sore throat. Nonspecific dental pain as above. Cardiovascular: Negative for chest pain. Respiratory: Negative for shortness of breath. Gastrointestinal: Negative for abdominal pain, vomiting and diarrhea. Skin: Negative for rash. Neurological: Negative for headaches, focal weakness or numbness. ____________________________________________  PHYSICAL EXAM:  VITAL SIGNS: ED Triage Vitals [12/30/15 1943]  Enc Vitals Group     BP (!) 110/50     Pulse Rate 74     Resp 18     Temp 98.5 F (36.9 C)     Temp Source Oral     SpO2 100 %     Weight 184 lb (83.5 kg)     Height 5\' 7"  (1.702 m)     Head Circumference      Peak Flow      Pain Score 10     Pain Loc      Pain Edu?      Excl. in GC?    Constitutional: Alert and oriented. Well appearing and in no distress. Head: Normocephalic and atraumatic.      Eyes: Conjunctivae are normal. PERRL. Normal extraocular movements      Ears: Canals clear. TMs intact bilaterally.   Nose: No congestion/rhinorrhea.   Mouth/Throat: Mucous membranes are moist. Uvula  is midline and tonsils are flat. No oropharyngeal lesions are appreciated. No focal dental abscess, gum swelling, fluctuance, erythema, or edema. No fracture or chronic cavities are appreciated.   Neck: Supple. No thyromegaly. Hematological/Lymphatic/Immunological: No cervical lymphadenopathy. Cardiovascular: Normal rate, regular rhythm.  Respiratory: Normal respiratory effort. No wheezes/rales/rhonchi. Skin:  Skin is warm, dry and intact. No rash noted. ____________________________________________  PROCEDURES  Viscous lidocaine 2 % gargle and  spit ____________________________________________  INITIAL IMPRESSION / ASSESSMENT AND PLAN / ED COURSE  Patient with acute dental pain and no obvious dental infection, abscess, or cellulitis. She will be discharged with a prescription for clindamycin dosing. We've dental infection. She is also provided with a prescription for Relafen the dose as needed for pain relief. She will follow with her dental provider as planned.  Clinical Course   ____________________________________________  FINAL CLINICAL IMPRESSION(S) / ED DIAGNOSES  Final diagnoses:  Toothache  Dentalgia  Pain due to dental caries      Lissa HoardJenise V Bacon Ravleen Ries, PA-C 01/01/16 0031    Phineas SemenGraydon Goodman, MD 01/03/16 1504

## 2016-05-26 ENCOUNTER — Encounter: Payer: Self-pay | Admitting: *Deleted

## 2016-05-26 DIAGNOSIS — J45909 Unspecified asthma, uncomplicated: Secondary | ICD-10-CM | POA: Insufficient documentation

## 2016-05-26 DIAGNOSIS — F17200 Nicotine dependence, unspecified, uncomplicated: Secondary | ICD-10-CM | POA: Diagnosis not present

## 2016-05-26 DIAGNOSIS — R0789 Other chest pain: Secondary | ICD-10-CM | POA: Insufficient documentation

## 2016-05-26 LAB — BASIC METABOLIC PANEL
Anion gap: 6 (ref 5–15)
BUN: 20 mg/dL (ref 6–20)
CHLORIDE: 108 mmol/L (ref 101–111)
CO2: 25 mmol/L (ref 22–32)
CREATININE: 0.84 mg/dL (ref 0.44–1.00)
Calcium: 8.7 mg/dL — ABNORMAL LOW (ref 8.9–10.3)
GFR calc Af Amer: >60 mL/min (ref >60)
GLUCOSE: 121 mg/dL — AB (ref 65–99)
POTASSIUM: 3.7 mmol/L (ref 3.5–5.1)
SODIUM: 139 mmol/L (ref 135–145)

## 2016-05-26 LAB — CBC
HEMATOCRIT: 37.4 % (ref 35.0–47.0)
Hemoglobin: 13.1 g/dL (ref 12.0–16.0)
MCH: 32 pg (ref 26.0–34.0)
MCHC: 35.1 g/dL (ref 32.0–36.0)
MCV: 91.1 fL (ref 80.0–100.0)
PLATELETS: 215 10*3/uL (ref 150–440)
RBC: 4.1 MIL/uL (ref 3.80–5.20)
RDW: 12.8 % (ref 11.5–14.5)
WBC: 12.1 10*3/uL — ABNORMAL HIGH (ref 3.6–11.0)

## 2016-05-26 LAB — TROPONIN I: Troponin I: <0.03 ng/mL (ref <0.03)

## 2016-05-26 NOTE — ED Triage Notes (Signed)
Pt has chest pain for 2 days.  Pt reports numbness in both arms.  Pt has a dry cough .  No sob.  cig smoker.  Pt alert.   Speech clear.

## 2016-05-27 ENCOUNTER — Emergency Department
Admission: EM | Admit: 2016-05-27 | Discharge: 2016-05-27 | Disposition: A | Discharge status: Home / Self Care | Payer: Medicaid Other | Attending: Emergency Medicine | Admitting: Emergency Medicine

## 2016-05-27 ENCOUNTER — Emergency Department: Payer: Medicaid Other

## 2016-05-27 DIAGNOSIS — R0789 Other chest pain: Secondary | ICD-10-CM

## 2016-05-27 LAB — FIBRIN DERIVATIVES D-DIMER (ARMC ONLY): FIBRIN DERIVATIVES D-DIMER (ARMC): 182 (ref 0–499)

## 2016-05-27 LAB — TROPONIN I: Troponin I: <0.03 ng/mL (ref <0.03)

## 2016-05-27 MED ORDER — IBUPROFEN 600 MG PO TABS
600.0000 mg | ORAL_TABLET | ORAL | Status: AC
  Administered 2016-05-27: 600 mg via ORAL
  Filled 2016-05-27: qty 1

## 2016-05-27 NOTE — ED Notes (Signed)
Patient c/o intermittent medial/left chest pain that radiates to bilateral arms, neck, jaw and back. Pt reports associated symptoms of lighted-headed and weakness.

## 2016-05-27 NOTE — Discharge Instructions (Signed)

## 2016-05-27 NOTE — ED Provider Notes (Signed)
[facility] Emergency Department Provider Note   ____________________________________________   First MD Initiated Contact with Patient 05/27/16 [phone]     (approximate)  I have reviewed the triage vital signs and the nursing notes.   HISTORY  Chief Complaint Chest Pain   HPI Melissa Black is a 36 y.o. female here for evaluation of chest pain.  Patient reports for about the last 2 days she's been experiencing a hard to describe sense of discomfort in the upper chest. It comes across the front of the chest, and does not radiate. She has had a dry cough for about 3 days, but reports that deep breathing does not make it worse. She has not feels short of breath.  Denies any nausea or vomiting. No fevers. No runny nose.  No abdominal pain, denies pregnancy with her last menstrual period less than 1 month ago and on the Depo-Medrol shot. She is a smoker.  No personal history of heart disease. Describes discomfort as mild at this time, worse earlier today.  Past Medical History:  Diagnosis Date  . Asthma     There are no active problems to display for this patient.   Past Surgical History:  Procedure Laterality Date  . CESAREAN SECTION      Prior to Admission medications   Medication Sig Start Date End Date Taking? Authorizing Provider  albuterol (PROVENTIL HFA;VENTOLIN HFA) 108 (90 Base) MCG/ACT inhaler Inhale 1-2 puffs into the lungs every 6 (six) hours as needed for wheezing or shortness of breath.    Historical Provider, MD  DULoxetine (CYMBALTA) 60 MG capsule Take 60 mg by mouth 2 (two) times daily as needed (for depression.).    Historical Provider, MD  LORazepam (ATIVAN) 0.5 MG tablet Take 1 tablet (0.5 mg total) by mouth every 8 (eight) hours as needed for anxiety. 09/15/15 09/14/16  Rebecka Apley, MD  nabumetone (RELAFEN) 750 MG tablet Take 1 tablet (750 mg total) by mouth 2 (two) times daily. 12/30/15   Jenise V Bacon Menshew, PA-C     Allergies Codeine; Penicillins; and Benadryl [diphenhydramine]  No family history on file.  Social History Social History  Substance Use Topics  . Smoking status: Current Every Day Smoker  . Smokeless tobacco: Never Used  . Alcohol use No    Review of Systems Constitutional: No fever/chills Eyes: No visual changes. ENT: No sore throat. Cardiovascular: See history of present illness Respiratory: Denies shortness of breath. Gastrointestinal: No abdominal pain.  No nausea, no vomiting.  No diarrhea.  No constipation. Genitourinary: Negative for dysuria. Musculoskeletal: Negative for back pain. Skin: Negative for rash. Neurological: Negative for headaches, focal weakness or numbness.  10-point ROS otherwise negative.  ____________________________________________   PHYSICAL EXAM:  VITAL SIGNS: ED Triage Vitals  Enc Vitals Group     BP 05/26/16 2212 105/62     Pulse Rate 05/26/16 2212 78     Resp 05/26/16 2212 18     Temp 05/26/16 2212 98.3 F (36.8 C)     Temp Source 05/26/16 2212 Oral     SpO2 05/26/16 2212 99 %     Weight 05/26/16 2214 186 lb (84.4 kg)     Height 05/26/16 2214 5\' 7"  (1.702 m)     Head Circumference --      Peak Flow --      Pain Score 05/26/16 2214 8     Pain Loc --      Pain Edu? --  Excl. in GC? --     Constitutional: Alert and oriented. Well appearing and in no acute distress.Patient initially asleep, she awakens to turn the lights on in the room. She is very pleasant. Eyes: Conjunctivae are normal. PERRL. EOMI. Head: Atraumatic. Nose: No congestion/rhinnorhea. Mouth/Throat: Mucous membranes are moist.  Oropharynx non-erythematous. Neck: No stridor.   Cardiovascular: Normal rate, regular rhythm. Grossly normal heart sounds.  Good peripheral circulation. Respiratory: Normal respiratory effort.  No retractions. Lungs CTAB. Gastrointestinal: Soft and nontender. No distention.  Musculoskeletal: No lower extremity tenderness nor  edema.  Neurologic:  Normal speech and language. No gross focal neurologic deficits are appreciated. No gait instability. Skin:  Skin is warm, dry and intact. No rash noted. Psychiatric: Mood and affect are normal. Speech and behavior are normal.  ____________________________________________   LABS (all labs ordered are listed, but only abnormal results are displayed)  Labs Reviewed  BASIC METABOLIC PANEL - Abnormal; Notable for the following:       Result Value   Glucose, Bld 121 (*)    Calcium 8.7 (*)    All other components within normal limits  CBC - Abnormal; Notable for the following:    WBC 12.1 (*)    All other components within normal limits  TROPONIN I  FIBRIN DERIVATIVES D-DIMER ([facility] ONLY)  TROPONIN I   ____________________________________________  EKG  ED ECG REPORT I, Jillien Yakel, the attending physician, personally viewed and interpreted this ECG.  Date: 05/27/2016 EKG Time: 2225 Rate: 75 Rhythm: normal sinus rhythm with slightly short PR interval QRS Axis: normal Intervals: normal ST/T Wave abnormalities: normal Conduction Disturbances: none Narrative Interpretation: unremarkable except slightly short PR interval  ____________________________________________  RADIOLOGY  Dg Chest 2 View  Result Date: 05/27/2016 CLINICAL DATA:  Chest pain and upper extremity paresthesias for 2 days. Nonproductive cough. EXAM: CHEST  2 VIEW COMPARISON:  09/15/2015 FINDINGS: The heart size and mediastinal contours are within normal limits. Both lungs are clear. The visualized skeletal structures are unremarkable. IMPRESSION: No active cardiopulmonary disease. Electronically Signed   By: Ellery Plunk M.D.   On: 05/27/2016 03:27    ____________________________________________   PROCEDURES  Procedure(s) performed: None  Procedures  Critical Care performed: No  ____________________________________________   INITIAL IMPRESSION / ASSESSMENT AND PLAN / ED  COURSE  Pertinent labs & imaging results that were available during my care of the patient were reviewed by me and considered in my medical decision making (see chart for details).  Chest discomfort. Very atypical acute coronary syndrome. Reassuring EKG, reassuring first troponin. Patient felt low risk for coronary syndrome based on history as well as HEART score. She does take Depakote shots and is a smoker, slightly increasing her risk for thromboembolic disease, but her pretest probability remains low and d-dimer today excludes the possibility of pulmonary emboli  No ripping tearing or moving pain. No significant risk factors for dissection or hypertension.  Patient reports her pain is actually improved significantly when I saw her as opposed when she came to the ER  ----------------------------------------- 4:36 AM on 05/27/2016 -----------------------------------------  The patient is resting comfortably. Normal vital signs. Workup negative for acute or concerning cause of her chest discomfort, I most suspect likely a musculoskeletal component combined with a slight dry cough the last 2 days. Afebrile, no runny nose or congestion, no signs of influenza.  Discussed with the patient, we'll discharge her recommending close follow-up with her primary care doctor in the next couple of days.  Return precautions and treatment recommendations  and follow-up discussed with the patient who is agreeable with the plan.       ____________________________________________   FINAL CLINICAL IMPRESSION(S) / ED DIAGNOSES  Final diagnoses:  Atypical chest pain      NEW MEDICATIONS STARTED DURING THIS VISIT:  New Prescriptions   No medications on file     Note:  This document was prepared using Dragon voice recognition software and may include unintentional dictation errors.     Sharyn CreamerMark Mikhala Kenan, MD 05/27/16 [phone]

## 2018-06-06 ENCOUNTER — Encounter: Payer: Self-pay | Admitting: Emergency Medicine

## 2018-06-06 ENCOUNTER — Emergency Department
Admission: EM | Admit: 2018-06-06 | Discharge: 2018-06-06 | Disposition: A | Discharge status: Home / Self Care | Payer: Medicaid Other | Attending: Emergency Medicine | Admitting: Emergency Medicine

## 2018-06-06 ENCOUNTER — Other Ambulatory Visit: Payer: Self-pay

## 2018-06-06 DIAGNOSIS — J45909 Unspecified asthma, uncomplicated: Secondary | ICD-10-CM | POA: Diagnosis not present

## 2018-06-06 DIAGNOSIS — Y939 Activity, unspecified: Secondary | ICD-10-CM | POA: Diagnosis not present

## 2018-06-06 DIAGNOSIS — Z79899 Other long term (current) drug therapy: Secondary | ICD-10-CM | POA: Diagnosis not present

## 2018-06-06 DIAGNOSIS — X58XXXA Exposure to other specified factors, initial encounter: Secondary | ICD-10-CM | POA: Insufficient documentation

## 2018-06-06 DIAGNOSIS — F17200 Nicotine dependence, unspecified, uncomplicated: Secondary | ICD-10-CM | POA: Insufficient documentation

## 2018-06-06 DIAGNOSIS — T161XXA Foreign body in right ear, initial encounter: Secondary | ICD-10-CM | POA: Diagnosis not present

## 2018-06-06 DIAGNOSIS — Y999 Unspecified external cause status: Secondary | ICD-10-CM | POA: Insufficient documentation

## 2018-06-06 DIAGNOSIS — Y929 Unspecified place or not applicable: Secondary | ICD-10-CM | POA: Diagnosis not present

## 2018-06-06 MED ORDER — LORATADINE 10 MG PO TABS: 10.0000 mg | ORAL_TABLET | Freq: Every day | ORAL | 0 refills | Status: DC

## 2018-06-06 MED ORDER — LORATADINE 10 MG PO TABS
10.0000 mg | ORAL_TABLET | Freq: Once | ORAL | Status: AC
  Administered 2018-06-06: 10 mg via ORAL
  Filled 2018-06-06: qty 1

## 2018-06-06 NOTE — ED Triage Notes (Signed)
Patient ambulatory to triage with steady gait, without difficulty, st awoke with something moving in her right ear

## 2018-06-06 NOTE — ED Provider Notes (Signed)
[facility] Emergency Department Provider Note _   First MD Initiated Contact with Patient 06/06/18 [phone]     (approximate)  I have reviewed the triage vital signs and the nursing notes.   HISTORY  Chief Complaint Foreign Body    HPI Melissa Black is a 38 y.o. female with history of asthma presents to the emergency department with awakening this morning with "something in my ear" feels like an insect crawling around".  Past Medical History:  Diagnosis Date  . Asthma     There are no active problems to display for this patient.   Past Surgical History:  Procedure Laterality Date  . CESAREAN SECTION      Prior to Admission medications   Medication Sig Start Date End Date Taking? Authorizing Provider  albuterol (PROVENTIL HFA;VENTOLIN HFA) 108 (90 Base) MCG/ACT inhaler Inhale 1-2 puffs into the lungs every 6 (six) hours as needed for wheezing or shortness of breath.    [provider]  DULoxetine (CYMBALTA) 60 MG capsule Take 60 mg by mouth 2 (two) times daily as needed (for depression.).    [provider]  loratadine (CLARITIN) 10 MG tablet Take 1 tablet (10 mg total) by mouth daily for 30 days. 06/06/18 07/06/18  Darci Current, MD  nabumetone (RELAFEN) 750 MG tablet Take 1 tablet (750 mg total) by mouth 2 (two) times daily. 12/30/15   Menshew, Charlesetta Ivory, PA-C    Allergies Codeine; Penicillins; and Benadryl [diphenhydramine]  No family history on file.  Social History Social History   Tobacco Use  . Smoking status: Current Every Day Smoker  . Smokeless tobacco: Never Used  Substance Use Topics  . Alcohol use: No  . Drug use: Not on file    Review of Systems Constitutional: No fever/chills Eyes: No visual changes. ENT: No sore throat.  Positive for right ear foreign body (insect) Cardiovascular: Denies chest pain. Respiratory: Denies shortness of breath. Gastrointestinal: No abdominal pain.  No nausea,  no vomiting.  No diarrhea.  No constipation. Genitourinary: Negative for dysuria. Musculoskeletal: Negative for neck pain.  Negative for back pain. Integumentary: Negative for rash. Neurological: Negative for headaches, focal weakness or numbness.   ____________________________________________   PHYSICAL EXAM:  VITAL SIGNS: ED Triage Vitals [06/06/18 0343]  Enc Vitals Group     BP 120/81     Pulse Rate 88     Resp 18     Temp 98 F (36.7 C)     Temp Source Oral     SpO2 100 %     Weight 80.3 kg (177 lb)     Height 1.702 m (5\' 7" )     Head Circumference      Peak Flow      Pain Score 0     Pain Loc      Pain Edu?      Excl. in GC?     Constitutional: Alert and oriented. Well appearing and in no acute distress. Eyes: Conjunctivae are normal. Ears: excoriations noted right external auditory canal.  No insect identified or any other foreign body Nose: No congestion/rhinnorhea. Mouth/Throat: Mucous membranes are moist. Oropharynx non-erythematous. Neck: No stridor.   Skin:  Skin is warm, dry and intact. No rash noted. Psychiatric: Mood and affect are normal. Speech and behavior are normal.     Procedures   ____________________________________________   INITIAL IMPRESSION / ASSESSMENT AND PLAN / ED COURSE  As part of my medical decision making, I  reviewed the following data within the electronic MEDICAL RECORD NUMBER Patient's right ear irrigated with 40 mL's of normal saline no foreign body or insect identified.  Patient states that she no longer feels foreign body sensation in the right ear. ____________________________________________  FINAL CLINICAL IMPRESSION(S) / ED DIAGNOSES  Final diagnoses:  Foreign body of right ear, initial encounter     MEDICATIONS GIVEN DURING THIS VISIT:  Medications  loratadine (CLARITIN) tablet 10 mg (10 mg Oral Given 06/06/18 0418)     ED Discharge Orders         Ordered    loratadine (CLARITIN) 10 MG tablet  Daily      06/06/18 0359           Note:  This document was prepared using Dragon voice recognition software and may include unintentional dictation errors.   Darci CurrentBrown, Shelley N, MD 06/06/18 [phone]

## 2018-09-27 DIAGNOSIS — J3089 Other allergic rhinitis: Secondary | ICD-10-CM | POA: Insufficient documentation

## 2019-02-28 ENCOUNTER — Other Ambulatory Visit: Payer: Self-pay

## 2019-02-28 DIAGNOSIS — Z20822 Contact with and (suspected) exposure to covid-19: Secondary | ICD-10-CM

## 2019-03-01 LAB — NOVEL CORONAVIRUS, NAA: SARS-CoV-2, NAA: NOT DETECTED

## 2019-06-12 ENCOUNTER — Ambulatory Visit: Payer: Medicaid Other | Attending: Internal Medicine

## 2019-10-09 ENCOUNTER — Ambulatory Visit (INDEPENDENT_AMBULATORY_CARE_PROVIDER_SITE_OTHER): Payer: Medicaid Other | Admitting: Dermatology

## 2019-10-09 ENCOUNTER — Other Ambulatory Visit: Payer: Self-pay

## 2019-10-09 DIAGNOSIS — L750 Bromhidrosis: Secondary | ICD-10-CM | POA: Diagnosis not present

## 2019-10-09 DIAGNOSIS — L74510 Primary focal hyperhidrosis, axilla: Secondary | ICD-10-CM

## 2019-10-09 DIAGNOSIS — L74519 Primary focal hyperhidrosis, unspecified: Secondary | ICD-10-CM

## 2019-10-09 MED ORDER — QBREXZA 2.4 % EX PADS
MEDICATED_PAD | CUTANEOUS | 3 refills | Status: DC
Start: 2019-10-09 — End: 2020-07-01

## 2019-10-09 NOTE — Progress Notes (Signed)
   New Patient Visit  Subjective  Melissa Black is a 39 y.o. female who presents for the following: excessive sweating (for many years - patient has to wear 2-3 shirts due to excessive sweating that wets her clothes. She sweats in her groin and inframammary areas too.  She has tried multiple deordorants including prescription strength Drysol Dab-O-Matic but nothing helps. Sweating has a significant effect on her daily life and mental health).  The following portions of the chart were reviewed this encounter and updated as appropriate:  Tobacco  Allergies  Meds  Problems  Med Hx  Surg Hx  Fam Hx     Review of Systems:  No other skin or systemic complaints except as noted in HPI or Assessment and Plan.  Objective  Well appearing patient in no apparent distress; mood and affect are within normal limits.  A focused examination was performed including the axilla. Relevant physical exam findings are noted in the Assessment and Plan.  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 Odor is also an issue.  It affects her activities of daily living; work; social life and psyche.  Start Qbrexa towelettes one wipe to each axilla QHS - advised patient to wash her hands after use and avoid contact with eyes. Continue to use deodorant daily. Discussed potential side effects.  Glycopyrronium Tosylate (QBREXZA) 2.4 % PADS - B/L axilla  Discussed other options such as oral glycopyrrolate; Botox injections; iontopheresis; sympathectomy.  Return in about 7 weeks (around 11/27/2019).  Maylene Roes, CMA, am acting as scribe for Armida Sans, MD .  Documentation: I have reviewed the above documentation for accuracy and  completeness, and I agree with the above.  Armida Sans, MD

## 2019-10-13 ENCOUNTER — Encounter: Payer: Self-pay | Admitting: Dermatology

## 2019-12-04 ENCOUNTER — Other Ambulatory Visit: Payer: Self-pay

## 2019-12-04 ENCOUNTER — Ambulatory Visit (INDEPENDENT_AMBULATORY_CARE_PROVIDER_SITE_OTHER): Payer: Medicaid Other | Admitting: Dermatology

## 2019-12-04 DIAGNOSIS — L74510 Primary focal hyperhidrosis, axilla: Secondary | ICD-10-CM

## 2019-12-04 DIAGNOSIS — L74519 Primary focal hyperhidrosis, unspecified: Secondary | ICD-10-CM

## 2019-12-04 MED ORDER — GLYCOPYRROLATE 1 MG PO TABS: 1.0000 mg | ORAL_TABLET | Freq: Two times a day (BID) | ORAL | 1 refills | Status: DC

## 2019-12-04 NOTE — Progress Notes (Signed)
   Follow-Up Visit   Subjective  Melissa Black is a 39 y.o. female who presents for the following: Excessive Sweating (7 weeks f/u excessive sweating at [place], treating with Qbrexa with a poor reponse ). Pt using Mens deodorant with a poor reponse.  Pt reports sweating interfering with activities of daily living due to smell and dripping sweat at all times of the day. Pt report she tried a tablet in the past she cant remember the name of the tablet - but did not work. She has dealt with years of staining and ruining of clothing, embarrassment, and persistent wetness requiring 2 to her to change her clothes frequently and interfering with work and other family and activities of daily living.  The following portions of the chart were reviewed this encounter and updated as appropriate:  Tobacco  Allergies  Meds  Problems  Med Hx  Surg Hx  Fam Hx     Review of Systems:  No other skin or systemic complaints except as noted in HPI or Assessment and Plan.  Objective  Well appearing patient in no apparent distress; mood and affect are within normal limits.  A focused examination was performed including face, axilla. Relevant physical exam findings are noted in the Assessment and Plan.  Objective  Right Axilla: Dripping sweat at axilla in the office today    Assessment & Plan  Primary focal hyperhidrosis -bilateral axillae -severe affecting activities of daily living.  Not responding to Qbrexa Bilateral axilla  Start Robinul 1 mg take 1 tablet bid, watch for side effects while taking Robinul dry eyes, dry mouth, dry skin etc   Cont Qbrexa wipes daily   We will send Botox in for prior approval    Discussed other treatment options such as iontophoresis, Botox, ultrasound treatments, and sympathectomy surgery options to treat sweating.  Other Related Medications Glycopyrronium Tosylate (QBREXZA) 2.4 % PADS botulinum toxin Type A (BOTOX) 100 units SOLR injection  Return in  about 1 month (around 01/04/2020).  IAngelique Holm, CMA, am acting as scribe for Armida Sans, MD .  Documentation: I have reviewed the above documentation for accuracy and completeness, and I agree with the above.  Armida Sans, MD

## 2019-12-05 ENCOUNTER — Other Ambulatory Visit: Payer: Self-pay

## 2019-12-05 ENCOUNTER — Encounter: Payer: Self-pay | Admitting: Dermatology

## 2019-12-05 DIAGNOSIS — L74519 Primary focal hyperhidrosis, unspecified: Secondary | ICD-10-CM

## 2019-12-05 MED ORDER — BOTOX 100 UNITS IJ SOLR: 100.0000 [IU] | INTRAMUSCULAR | 2 refills | Status: DC

## 2019-12-05 NOTE — Progress Notes (Signed)
Prescription sent in per Dr. Gwen Pounds.

## 2020-01-08 ENCOUNTER — Ambulatory Visit: Payer: Medicaid Other | Admitting: Dermatology

## 2020-01-15 ENCOUNTER — Other Ambulatory Visit: Payer: Self-pay

## 2020-01-15 ENCOUNTER — Encounter: Payer: Self-pay | Admitting: Dermatology

## 2020-01-15 ENCOUNTER — Ambulatory Visit (INDEPENDENT_AMBULATORY_CARE_PROVIDER_SITE_OTHER): Payer: Medicaid Other | Admitting: Dermatology

## 2020-01-15 DIAGNOSIS — L74519 Primary focal hyperhidrosis, unspecified: Secondary | ICD-10-CM

## 2020-01-15 DIAGNOSIS — L74510 Primary focal hyperhidrosis, axilla: Secondary | ICD-10-CM

## 2020-01-15 MED ORDER — ONABOTULINUMTOXINA 100 UNITS IJ SOLR
100.0000 [IU] | Freq: Once | INTRAMUSCULAR | Status: AC
  Administered 2020-01-15: 100 [IU] via INTRAMUSCULAR

## 2020-01-15 NOTE — Progress Notes (Signed)
   Follow-Up Visit   Subjective  Melissa Black is a 39 y.o. female who presents for the following: hyperhidrosis (patient currently using Robinul and Qbrexa but still experiencing excessive sweating. Patient is here today for her first Botox injections).  The following portions of the chart were reviewed this encounter and updated as appropriate:  Tobacco  Allergies  Meds  Problems  Med Hx  Surg Hx  Fam Hx     Review of Systems:  No other skin or systemic complaints except as noted in HPI or Assessment and Plan.  Objective  Well appearing patient in no apparent distress; mood and affect are within normal limits.  A focused examination was performed including the axilla. Relevant physical exam findings are noted in the Assessment and Plan.  Objective  B/L axilla: Wetness under the arms  Assessment & Plan  Primary focal hyperhidrosis B/L axilla  Botox 100 units (diluted with 4 cc saline)  injected into multiple sites of the B/L axilla 50 u each axilla. Patient tolerated injections well. AL, CMA  Continue Robinul 1mg  po QD and Qbrexa QD for 2 more weeks then D/C.   botulinum toxin Type A (BOTOX) injection 100 Units - B/L axilla  Other Related Medications Glycopyrronium Tosylate (QBREXZA) 2.4 % PADS botulinum toxin Type A (BOTOX) 100 units SOLR injection  Return in about 4 months (around 05/16/2020).  05/18/2020, CMA, am acting as scribe for Maylene Roes, MD .  Documentation: I have reviewed the above documentation for accuracy and completeness, and I agree with the above.  Armida Sans, MD

## 2020-05-12 ENCOUNTER — Ambulatory Visit: Payer: Medicaid Other | Admitting: Dermatology

## 2020-07-01 ENCOUNTER — Ambulatory Visit: Payer: Medicaid Other | Admitting: Dermatology

## 2020-07-01 ENCOUNTER — Encounter: Payer: Self-pay | Admitting: Dermatology

## 2020-07-01 ENCOUNTER — Other Ambulatory Visit: Payer: Self-pay

## 2020-07-01 DIAGNOSIS — L74510 Primary focal hyperhidrosis, axilla: Secondary | ICD-10-CM | POA: Diagnosis not present

## 2020-07-01 DIAGNOSIS — L74519 Primary focal hyperhidrosis, unspecified: Secondary | ICD-10-CM

## 2020-07-01 MED ORDER — ONABOTULINUMTOXINA 100 UNITS IJ SOLR: 100.0000 [IU] | Freq: Once | INTRAMUSCULAR | Status: DC

## 2020-07-01 NOTE — Patient Instructions (Signed)
[facility] [phone]

## 2020-07-01 NOTE — Progress Notes (Signed)
   Follow-Up Visit   Subjective  Melissa Black is a 40 y.o. female who presents for the following: Excessive Sweating (Bilateral axilla - Botox injections today).  Her Botox seem to be lasting about 4 months.  After 4 months she starts wetting again.  The following portions of the chart were reviewed this encounter and updated as appropriate:   Tobacco  Allergies  Meds  Problems  Med Hx  Surg Hx  Fam Hx     Review of Systems:  No other skin or systemic complaints except as noted in HPI or Assessment and Plan.  Objective  Well appearing patient in no apparent distress; mood and affect are within normal limits.  A focused examination was performed including bilateral axilla. Relevant physical exam findings are noted in the Assessment and Plan.  Objective  Bilateral axilla: Wetness under the arms.   Assessment & Plan  Primary focal hyperhidrosis Bilateral axilla Improved with Botox injections.  Due to lasting only 4 months, we will start doing Botox at 4 months intervals.  Botox 100 units (diluted with 4 cc saline)  injected into multiple sites of the B/L axilla 50 u each axilla. Patient tolerated injections well. Lot #[id number] Exp 04/2022  botulinum toxin Type A (BOTOX) injection 100 Units - Bilateral axilla  Return in about 4 months (around 10/31/2020) for Botox for hyperhidrosis.  I, Joanie Coddington, CMA, am acting as scribe for Armida Sans, MD .  Documentation: I have reviewed the above documentation for accuracy and completeness, and I agree with the above.  Armida Sans, MD

## 2020-08-06 ENCOUNTER — Other Ambulatory Visit: Payer: Self-pay | Admitting: Sports Medicine

## 2020-08-06 DIAGNOSIS — G8929 Other chronic pain: Secondary | ICD-10-CM

## 2020-08-06 DIAGNOSIS — M5417 Radiculopathy, lumbosacral region: Secondary | ICD-10-CM

## 2020-08-06 DIAGNOSIS — M5137 Other intervertebral disc degeneration, lumbosacral region: Secondary | ICD-10-CM

## 2020-08-06 DIAGNOSIS — M5441 Lumbago with sciatica, right side: Secondary | ICD-10-CM

## 2020-08-07 ENCOUNTER — Other Ambulatory Visit: Payer: Self-pay

## 2020-08-07 ENCOUNTER — Ambulatory Visit
Admission: RE | Admit: 2020-08-07 | Discharge: 2020-08-07 | Disposition: A | Discharge status: Home / Self Care | Payer: Medicaid Other | Source: Ambulatory Visit | Attending: Sports Medicine | Admitting: Sports Medicine

## 2020-08-07 DIAGNOSIS — M5417 Radiculopathy, lumbosacral region: Secondary | ICD-10-CM | POA: Insufficient documentation

## 2020-08-07 DIAGNOSIS — M5441 Lumbago with sciatica, right side: Secondary | ICD-10-CM | POA: Insufficient documentation

## 2020-08-07 DIAGNOSIS — G8929 Other chronic pain: Secondary | ICD-10-CM | POA: Diagnosis present

## 2020-08-07 DIAGNOSIS — M5137 Other intervertebral disc degeneration, lumbosacral region: Secondary | ICD-10-CM | POA: Insufficient documentation

## 2020-11-16 ENCOUNTER — Ambulatory Visit: Payer: Medicaid Other | Admitting: Dermatology

## 2021-02-04 ENCOUNTER — Emergency Department
Admission: EM | Admit: 2021-02-04 | Discharge: 2021-02-04 | Disposition: A | Discharge status: Home / Self Care | Payer: Medicaid Other | Attending: Emergency Medicine | Admitting: Emergency Medicine

## 2021-02-04 DIAGNOSIS — Z5321 Procedure and treatment not carried out due to patient leaving prior to being seen by health care provider: Secondary | ICD-10-CM | POA: Insufficient documentation

## 2021-02-04 DIAGNOSIS — R0602 Shortness of breath: Secondary | ICD-10-CM | POA: Insufficient documentation

## 2021-02-04 NOTE — ED Notes (Signed)
Pt noted removing BP cuff and exiting triage and then ED lobby

## 2021-06-11 ENCOUNTER — Other Ambulatory Visit: Payer: Self-pay | Admitting: Physical Medicine & Rehabilitation

## 2021-06-11 ENCOUNTER — Other Ambulatory Visit (HOSPITAL_COMMUNITY): Payer: Self-pay | Admitting: Physical Medicine & Rehabilitation

## 2021-06-11 DIAGNOSIS — M5441 Lumbago with sciatica, right side: Secondary | ICD-10-CM

## 2021-06-11 DIAGNOSIS — G8929 Other chronic pain: Secondary | ICD-10-CM

## 2021-06-11 DIAGNOSIS — M48062 Spinal stenosis, lumbar region with neurogenic claudication: Secondary | ICD-10-CM

## 2021-06-22 ENCOUNTER — Other Ambulatory Visit: Payer: Self-pay

## 2021-06-22 ENCOUNTER — Ambulatory Visit
Admission: RE | Admit: 2021-06-22 | Discharge: 2021-06-22 | Disposition: A | Discharge status: Home / Self Care | Payer: Medicaid Other | Source: Ambulatory Visit | Attending: Physical Medicine & Rehabilitation | Admitting: Physical Medicine & Rehabilitation

## 2021-06-22 DIAGNOSIS — M48062 Spinal stenosis, lumbar region with neurogenic claudication: Secondary | ICD-10-CM | POA: Insufficient documentation

## 2021-06-22 DIAGNOSIS — G8929 Other chronic pain: Secondary | ICD-10-CM | POA: Diagnosis present

## 2021-06-22 DIAGNOSIS — M5441 Lumbago with sciatica, right side: Secondary | ICD-10-CM | POA: Insufficient documentation

## 2021-08-13 ENCOUNTER — Other Ambulatory Visit: Payer: Self-pay | Admitting: Neurosurgery

## 2021-08-13 DIAGNOSIS — Z01818 Encounter for other preprocedural examination: Secondary | ICD-10-CM

## 2021-08-20 ENCOUNTER — Encounter
Admission: RE | Admit: 2021-08-20 | Discharge: 2021-08-20 | Disposition: A | Discharge status: Home / Self Care | Payer: Medicaid Other | Source: Ambulatory Visit | Attending: Neurosurgery | Admitting: Neurosurgery

## 2021-08-20 VITALS — BP 100/74 | HR 85 | Resp 16 | Ht 67.0 in | Wt 200.6 lb

## 2021-08-20 DIAGNOSIS — R079 Chest pain, unspecified: Secondary | ICD-10-CM | POA: Diagnosis not present

## 2021-08-20 DIAGNOSIS — Z01818 Encounter for other preprocedural examination: Secondary | ICD-10-CM | POA: Insufficient documentation

## 2021-08-20 DIAGNOSIS — M48061 Spinal stenosis, lumbar region without neurogenic claudication: Secondary | ICD-10-CM | POA: Insufficient documentation

## 2021-08-20 DIAGNOSIS — J45909 Unspecified asthma, uncomplicated: Secondary | ICD-10-CM | POA: Insufficient documentation

## 2021-08-20 DIAGNOSIS — M5416 Radiculopathy, lumbar region: Secondary | ICD-10-CM | POA: Diagnosis not present

## 2021-08-20 DIAGNOSIS — E669 Obesity, unspecified: Secondary | ICD-10-CM | POA: Diagnosis not present

## 2021-08-20 DIAGNOSIS — Z01812 Encounter for preprocedural laboratory examination: Secondary | ICD-10-CM

## 2021-08-20 HISTORY — DX: Anemia, unspecified: D64.9

## 2021-08-20 HISTORY — DX: Chest pain, unspecified: R07.9

## 2021-08-20 HISTORY — DX: Cannabis use, unspecified, in remission: F12.91

## 2021-08-20 HISTORY — DX: Anxiety disorder, unspecified: F41.9

## 2021-08-20 HISTORY — DX: Shortness of breath: R06.02

## 2021-08-20 HISTORY — DX: Spinal stenosis, lumbar region without neurogenic claudication: M48.061

## 2021-08-20 HISTORY — DX: Obesity, unspecified: E66.9

## 2021-08-20 HISTORY — DX: Radiculopathy, lumbar region: M48.061

## 2021-08-20 HISTORY — DX: Spinal stenosis, lumbar region without neurogenic claudication: M54.16

## 2021-08-20 HISTORY — DX: Herpesviral infection of urogenital system, unspecified: A60.00

## 2021-08-20 HISTORY — DX: Attention-deficit hyperactivity disorder, unspecified type: F90.9

## 2021-08-20 HISTORY — DX: Personal history of other infectious and parasitic diseases: Z86.19

## 2021-08-20 LAB — BASIC METABOLIC PANEL
Anion gap: 4 — ABNORMAL LOW (ref 5–15)
BUN: 12 mg/dL (ref 6–20)
CO2: 22 mmol/L (ref 22–32)
Calcium: 8.6 mg/dL — ABNORMAL LOW (ref 8.9–10.3)
Chloride: 111 mmol/L (ref 98–111)
Creatinine, Ser: 1.03 mg/dL — ABNORMAL HIGH (ref 0.44–1.00)
GFR, Estimated: >60 mL/min (ref >60)
Glucose, Bld: 91 mg/dL (ref 70–99)
Potassium: 3.6 mmol/L (ref 3.5–5.1)
Sodium: 137 mmol/L (ref 135–145)

## 2021-08-20 LAB — CBC
HCT: 42.3 % (ref 36.0–46.0)
Hemoglobin: 14.5 g/dL (ref 12.0–15.0)
MCH: 31 pg (ref 26.0–34.0)
MCHC: 34.3 g/dL (ref 30.0–36.0)
MCV: 90.4 fL (ref 80.0–100.0)
Platelets: 321 10*3/uL (ref 150–400)
RBC: 4.68 MIL/uL (ref 3.87–5.11)
RDW: 12.3 % (ref 11.5–15.5)
WBC: 9.5 10*3/uL (ref 4.0–10.5)
nRBC: 0 % (ref 0.0–0.2)

## 2021-08-20 LAB — URINALYSIS, COMPLETE (UACMP) WITH MICROSCOPIC
Bilirubin Urine: NEGATIVE
Glucose, UA: NEGATIVE mg/dL
Hgb urine dipstick: NEGATIVE
Ketones, ur: NEGATIVE mg/dL
Leukocytes,Ua: NEGATIVE
Nitrite: NEGATIVE
Protein, ur: NEGATIVE mg/dL
Specific Gravity, Urine: 1.026 (ref 1.005–1.030)
pH: 5 (ref 5.0–8.0)

## 2021-08-20 LAB — TYPE AND SCREEN
ABO/RH(D): A POS
Antibody Screen: NEGATIVE

## 2021-08-20 LAB — SURGICAL PCR SCREEN
MRSA, PCR: NEGATIVE
Staphylococcus aureus: NEGATIVE

## 2021-08-20 NOTE — Patient Instructions (Signed)
Your procedure is scheduled on:08-30-21 Monday ?Report to the Registration Desk on the 1st floor of the [facility].Then proceed to the 2nd floor Surgery Desk ?To find out your arrival time, please call [phone] between 1PM - 3PM on:08-27-21 Friday ?If your arrival time is 6:00 am, do not arrive prior to that time as the [facility] entrance doors do not open until 6:00 am. ? ?REMEMBER: ?Instructions that are not followed completely may result in serious medical risk, up to and including death; or upon the discretion of your surgeon and anesthesiologist your surgery may need to be rescheduled. ? ?Do not eat food after midnight the night before surgery.  ?No gum chewing, lozengers or hard candies. ? ?You may however, drink CLEAR liquids up to 2 hours before you are scheduled to arrive for your surgery. Do not drink anything within 2 hours of your scheduled arrival time. ? ?Clear liquids include: ?- water  ?- apple juice without pulp ?- gatorade (not RED colors) ?- black coffee or tea (Do NOT add milk or creamers to the coffee or tea) ?Do NOT drink anything that is not on this list. ? ?TAKE THESE MEDICATIONS THE MORNING OF SURGERY WITH A SIP OF WATER: ?-DULoxetine (CYMBALTA) ? ?Use your albuterol (PROVENTIL HFA;VENTOLIN ) Inhaler the day of surgery and bring your Inhaler to the hospital ? ?One week prior to surgery: ?Stop Anti-inflammatories (NSAIDS) such as Advil, Aleve, Ibuprofen, Motrin, Naproxen, Naprosyn and Aspirin based products such as Excedrin, Goodys Powder, BC Powder.You may however, take Tylenol if needed for pain up until the day of surgery. ? ?Stop ANY OVER THE COUNTER supplements/vitamins 7 days prior to surgery (Multivitamin) ? ?No Alcohol for 24 hours before or after surgery. ? ?No Smoking including e-cigarettes for 24 hours prior to surgery.  ?No chewable tobacco products for at least 6 hours prior to surgery.  ?No nicotine patches on the day of surgery. ? ?Do not use any "recreational" drugs for  at least a week prior to your surgery.  ?Please be advised that the combination of cocaine and anesthesia may have negative outcomes, up to and including death. ?If you test positive for cocaine, your surgery will be cancelled. ? ?On the morning of surgery brush your teeth with toothpaste and water, you may rinse your mouth with mouthwash if you wish. ?Do not swallow any toothpaste or mouthwash. ? ?Use CHG Soap as directed on instruction sheet. ? ?Do not wear jewelry, make-up, hairpins, clips or nail polish. ? ?Do not wear lotions, powders, or perfumes.  ? ?Do not shave body from the neck down 48 hours prior to surgery just in case you cut yourself which could leave a site for infection.  ?Also, freshly shaved skin may become irritated if using the CHG soap. ? ?Contact lenses, hearing aids and dentures may not be worn into surgery. ? ?Do not bring valuables to the hospital. [facility] is not responsible for any missing/lost belongings or valuables.  ? ?Notify your doctor if there is any change in your medical condition (cold, fever, infection). ? ?Wear comfortable clothing (specific to your surgery type) to the hospital. ? ?After surgery, you can help prevent lung complications by doing breathing exercises.  ?Take deep breaths and cough every 1-2 hours. Your doctor may order a device called an Incentive Spirometer to help you take deep breaths. ?When coughing or sneezing, hold a pillow firmly against your incision with both hands. This is called ?splinting.? Doing this helps protect your incision. It  also decreases belly discomfort. ? ?If you are being admitted to the hospital overnight, leave your suitcase in the car. ?After surgery it may be brought to your room. ? ?If you are being discharged the day of surgery, you will not be allowed to drive home. ?You will need a responsible adult (18 years or older) to drive you home and stay with you that night.  ? ?If you are taking public transportation, you will need to  have a responsible adult (18 years or older) with you. ?Please confirm with your physician that it is acceptable to use public transportation.  ? ?Please call the [facility] Dept. at [phone] if you have any questions about these instructions. ? ?Surgery Visitation Policy: ? ?Patients undergoing a surgery or procedure may have two family members or support persons with them as long as the person is not COVID-19 positive or experiencing its symptoms.  ? ?Inpatient Visitation:   ? ?Visiting hours are 7 a.m. to 8 p.m. ?Up to four visitors are allowed at one time in a patient room, including children. The visitors may rotate out with other people during the day. One designated support person (adult) may remain overnight.  ?

## 2021-08-30 ENCOUNTER — Encounter
Admission: RE | Disposition: A | Discharge status: Home / Self Care | Payer: Self-pay | Source: Home / Self Care | Attending: Neurosurgery

## 2021-08-30 ENCOUNTER — Encounter: Payer: Self-pay | Admitting: Neurosurgery

## 2021-08-30 ENCOUNTER — Other Ambulatory Visit: Payer: Self-pay

## 2021-08-30 ENCOUNTER — Ambulatory Visit: Payer: Medicaid Other

## 2021-08-30 ENCOUNTER — Ambulatory Visit: Payer: Medicaid Other | Admitting: Urgent Care

## 2021-08-30 ENCOUNTER — Ambulatory Visit
Admission: RE | Admit: 2021-08-30 | Discharge: 2021-08-30 | Disposition: A | Discharge status: Home / Self Care | Payer: Medicaid Other | Attending: Neurosurgery | Admitting: Neurosurgery

## 2021-08-30 DIAGNOSIS — M5416 Radiculopathy, lumbar region: Secondary | ICD-10-CM | POA: Insufficient documentation

## 2021-08-30 DIAGNOSIS — J45909 Unspecified asthma, uncomplicated: Secondary | ICD-10-CM

## 2021-08-30 DIAGNOSIS — M48061 Spinal stenosis, lumbar region without neurogenic claudication: Secondary | ICD-10-CM

## 2021-08-30 DIAGNOSIS — E669 Obesity, unspecified: Secondary | ICD-10-CM

## 2021-08-30 DIAGNOSIS — R079 Chest pain, unspecified: Secondary | ICD-10-CM

## 2021-08-30 DIAGNOSIS — Z01812 Encounter for preprocedural laboratory examination: Secondary | ICD-10-CM

## 2021-08-30 HISTORY — PX: LUMBAR LAMINECTOMY/DECOMPRESSION MICRODISCECTOMY: SHX5026

## 2021-08-30 LAB — POCT PREGNANCY, URINE: Preg Test, Ur: NEGATIVE

## 2021-08-30 LAB — ABO/RH: ABO/RH(D): A POS

## 2021-08-30 SURGERY — LUMBAR LAMINECTOMY/DECOMPRESSION MICRODISCECTOMY 2 LEVELS
Anesthesia: General | Site: Spine Lumbar | Laterality: Right

## 2021-08-30 MED ORDER — FENTANYL CITRATE (PF) 100 MCG/2ML IJ SOLN
25.0000 ug | INTRAMUSCULAR | Status: DC | PRN
  Administered 2021-08-30: 25 ug via INTRAVENOUS

## 2021-08-30 MED ORDER — MIDAZOLAM HCL 2 MG/2ML IJ SOLN
INTRAMUSCULAR | Status: DC | PRN
  Administered 2021-08-30: 2 mg via INTRAVENOUS

## 2021-08-30 MED ORDER — PROPOFOL 10 MG/ML IV BOLUS
INTRAVENOUS | Status: AC
  Filled 2021-08-30: qty 20

## 2021-08-30 MED ORDER — ONDANSETRON HCL 4 MG/2ML IJ SOLN
INTRAMUSCULAR | Status: DC | PRN
Start: 2021-08-30 — End: 2021-08-30
  Administered 2021-08-30: 4 mg via INTRAVENOUS

## 2021-08-30 MED ORDER — PROPOFOL 10 MG/ML IV BOLUS
INTRAVENOUS | Status: DC | PRN
  Administered 2021-08-30: 50 mg via INTRAVENOUS
  Administered 2021-08-30: 120 mg via INTRAVENOUS

## 2021-08-30 MED ORDER — DEXAMETHASONE SODIUM PHOSPHATE 10 MG/ML IJ SOLN
INTRAMUSCULAR | Status: AC
Start: 2021-08-30 — End: ?
  Filled 2021-08-30: qty 1

## 2021-08-30 MED ORDER — BUPIVACAINE-EPINEPHRINE (PF) 0.5% -1:200000 IJ SOLN
INTRAMUSCULAR | Status: AC
  Filled 2021-08-30: qty 30

## 2021-08-30 MED ORDER — FENTANYL CITRATE (PF) 100 MCG/2ML IJ SOLN
INTRAMUSCULAR | Status: AC
  Filled 2021-08-30: qty 2

## 2021-08-30 MED ORDER — LACTATED RINGERS IV SOLN: INTRAVENOUS | Status: DC

## 2021-08-30 MED ORDER — DEXAMETHASONE SODIUM PHOSPHATE 10 MG/ML IJ SOLN
INTRAMUSCULAR | Status: DC | PRN
  Administered 2021-08-30: 10 mg via INTRAVENOUS

## 2021-08-30 MED ORDER — SUCCINYLCHOLINE CHLORIDE 200 MG/10ML IV SOSY
PREFILLED_SYRINGE | INTRAVENOUS | Status: AC
Start: 2021-08-30 — End: ?
  Filled 2021-08-30: qty 10

## 2021-08-30 MED ORDER — ACETAMINOPHEN 10 MG/ML IV SOLN
INTRAVENOUS | Status: DC | PRN
Start: 2021-08-30 — End: 2021-08-30
  Administered 2021-08-30: 1000 mg via INTRAVENOUS

## 2021-08-30 MED ORDER — ACETAMINOPHEN 10 MG/ML IV SOLN
INTRAVENOUS | Status: AC
  Filled 2021-08-30: qty 100

## 2021-08-30 MED ORDER — ONDANSETRON HCL 4 MG/2ML IJ SOLN: 4.0000 mg | Freq: Once | INTRAMUSCULAR | Status: DC | PRN

## 2021-08-30 MED ORDER — ROCURONIUM BROMIDE 10 MG/ML (PF) SYRINGE
PREFILLED_SYRINGE | INTRAVENOUS | Status: AC
  Filled 2021-08-30: qty 10

## 2021-08-30 MED ORDER — ACETAMINOPHEN 10 MG/ML IV SOLN: 1000.0000 mg | Freq: Once | INTRAVENOUS | Status: DC | PRN

## 2021-08-30 MED ORDER — MIDAZOLAM HCL 2 MG/2ML IJ SOLN
INTRAMUSCULAR | Status: AC
  Filled 2021-08-30: qty 2

## 2021-08-30 MED ORDER — FAMOTIDINE 20 MG PO TABS
20.0000 mg | ORAL_TABLET | Freq: Once | ORAL | Status: AC
  Administered 2021-08-30: 20 mg via ORAL

## 2021-08-30 MED ORDER — FAMOTIDINE 20 MG PO TABS
ORAL_TABLET | ORAL | Status: AC
  Filled 2021-08-30: qty 1

## 2021-08-30 MED ORDER — CEFAZOLIN SODIUM-DEXTROSE 2-4 GM/100ML-% IV SOLN
INTRAVENOUS | Status: AC
  Filled 2021-08-30: qty 100

## 2021-08-30 MED ORDER — METHYLPREDNISOLONE ACETATE 40 MG/ML IJ SUSP
INTRAMUSCULAR | Status: DC | PRN
  Administered 2021-08-30: 40 mg

## 2021-08-30 MED ORDER — SEVOFLURANE IN SOLN
RESPIRATORY_TRACT | Status: AC
  Filled 2021-08-30: qty 250

## 2021-08-30 MED ORDER — HYDROMORPHONE HCL 1 MG/ML IJ SOLN
INTRAMUSCULAR | Status: AC
  Filled 2021-08-30: qty 1

## 2021-08-30 MED ORDER — 0.9 % SODIUM CHLORIDE (POUR BTL) OPTIME
TOPICAL | Status: DC | PRN
  Administered 2021-08-30: 500 mL

## 2021-08-30 MED ORDER — OXYCODONE HCL 5 MG PO TABS
ORAL_TABLET | ORAL | Status: AC
  Administered 2021-08-30: 5 mg via ORAL
  Filled 2021-08-30: qty 1

## 2021-08-30 MED ORDER — METHYLPREDNISOLONE ACETATE 40 MG/ML IJ SUSP
INTRAMUSCULAR | Status: AC
  Filled 2021-08-30: qty 1

## 2021-08-30 MED ORDER — CHLORHEXIDINE GLUCONATE 0.12 % MT SOLN
OROMUCOSAL | Status: AC
  Filled 2021-08-30: qty 15

## 2021-08-30 MED ORDER — SUCCINYLCHOLINE CHLORIDE 200 MG/10ML IV SOSY
PREFILLED_SYRINGE | INTRAVENOUS | Status: DC | PRN
  Administered 2021-08-30: 120 mg via INTRAVENOUS

## 2021-08-30 MED ORDER — METHOCARBAMOL 500 MG PO TABS: 750.0000 mg | ORAL_TABLET | Freq: Once | ORAL | Status: DC

## 2021-08-30 MED ORDER — ONDANSETRON HCL 4 MG/2ML IJ SOLN
INTRAMUSCULAR | Status: AC
  Filled 2021-08-30: qty 2

## 2021-08-30 MED ORDER — OXYCODONE HCL 5 MG PO TABS: 5.0000 mg | ORAL_TABLET | Freq: Once | ORAL | Status: AC | PRN

## 2021-08-30 MED ORDER — KETAMINE HCL 10 MG/ML IJ SOLN
INTRAMUSCULAR | Status: DC | PRN
  Administered 2021-08-30: 30 mg via INTRAVENOUS

## 2021-08-30 MED ORDER — SURGIFLO WITH THROMBIN (HEMOSTATIC MATRIX KIT) OPTIME
TOPICAL | Status: DC | PRN
  Administered 2021-08-30: 1 via TOPICAL

## 2021-08-30 MED ORDER — LIDOCAINE HCL (CARDIAC) PF 100 MG/5ML IV SOSY
PREFILLED_SYRINGE | INTRAVENOUS | Status: DC | PRN
  Administered 2021-08-30: 100 mg via INTRAVENOUS

## 2021-08-30 MED ORDER — OXYCODONE HCL 5 MG PO TABS: 5.0000 mg | ORAL_TABLET | ORAL | 0 refills | Status: AC | PRN

## 2021-08-30 MED ORDER — OXYCODONE HCL 5 MG/5ML PO SOLN: 5.0000 mg | Freq: Once | ORAL | Status: AC | PRN

## 2021-08-30 MED ORDER — FENTANYL CITRATE (PF) 100 MCG/2ML IJ SOLN
INTRAMUSCULAR | Status: DC | PRN
  Administered 2021-08-30 (×4): 50 ug via INTRAVENOUS

## 2021-08-30 MED ORDER — VANCOMYCIN HCL 1000 MG IV SOLR
INTRAVENOUS | Status: AC
  Filled 2021-08-30: qty 20

## 2021-08-30 MED ORDER — BUPIVACAINE-EPINEPHRINE (PF) 0.5% -1:200000 IJ SOLN
INTRAMUSCULAR | Status: DC | PRN
  Administered 2021-08-30: 8 mL

## 2021-08-30 MED ORDER — SODIUM CHLORIDE (PF) 0.9 % IJ SOLN
INTRAMUSCULAR | Status: DC | PRN
  Administered 2021-08-30: 60 mL via INTRAMUSCULAR

## 2021-08-30 MED ORDER — KETAMINE HCL 50 MG/5ML IJ SOSY
PREFILLED_SYRINGE | INTRAMUSCULAR | Status: AC
  Filled 2021-08-30: qty 5

## 2021-08-30 MED ORDER — DEXMEDETOMIDINE (PRECEDEX) IN NS 20 MCG/5ML (4 MCG/ML) IV SYRINGE
PREFILLED_SYRINGE | INTRAVENOUS | Status: DC | PRN
  Administered 2021-08-30: 4 ug via INTRAVENOUS
  Administered 2021-08-30 (×2): 8 ug via INTRAVENOUS

## 2021-08-30 MED ORDER — METHOCARBAMOL 750 MG PO TABS: 750.0000 mg | ORAL_TABLET | Freq: Three times a day (TID) | ORAL | 0 refills | Status: DC | PRN

## 2021-08-30 MED ORDER — HYDROMORPHONE HCL 1 MG/ML IJ SOLN
INTRAMUSCULAR | Status: DC | PRN
  Administered 2021-08-30 (×2): .5 mg via INTRAVENOUS

## 2021-08-30 MED ORDER — LIDOCAINE HCL (PF) 2 % IJ SOLN
INTRAMUSCULAR | Status: AC
  Filled 2021-08-30: qty 5

## 2021-08-30 MED ORDER — CHLORHEXIDINE GLUCONATE 0.12 % MT SOLN
15.0000 mL | Freq: Once | OROMUCOSAL | Status: AC
  Administered 2021-08-30: 15 mL via OROMUCOSAL

## 2021-08-30 MED ORDER — ORAL CARE MOUTH RINSE: 15.0000 mL | Freq: Once | OROMUCOSAL | Status: AC

## 2021-08-30 MED ORDER — DEXMEDETOMIDINE HCL IN NACL 80 MCG/20ML IV SOLN
INTRAVENOUS | Status: AC
  Filled 2021-08-30: qty 20

## 2021-08-30 MED ORDER — SENNA 8.6 MG PO TABS: 1.0000 | ORAL_TABLET | Freq: Every day | ORAL | 0 refills | Status: DC | PRN

## 2021-08-30 MED ORDER — BUPIVACAINE HCL (PF) 0.5 % IJ SOLN
INTRAMUSCULAR | Status: AC
  Filled 2021-08-30: qty 30

## 2021-08-30 MED ORDER — CEFAZOLIN SODIUM-DEXTROSE 2-3 GM-%(50ML) IV SOLR
INTRAVENOUS | Status: DC | PRN
Start: 2021-08-30 — End: 2021-08-30
  Administered 2021-08-30: 2 g via INTRAVENOUS

## 2021-08-30 MED ORDER — BUPIVACAINE LIPOSOME 1.3 % IJ SUSP
INTRAMUSCULAR | Status: AC
  Filled 2021-08-30: qty 20

## 2021-08-30 SURGICAL SUPPLY — 55 items
ADH SKN CLS APL DERMABOND .7 (GAUZE/BANDAGES/DRESSINGS) ×1
AGENT HMST KT MTR STRL THRMB (HEMOSTASIS) ×1
APL PRP STRL LF DISP 70% ISPRP (MISCELLANEOUS) ×2
BUR NEURO DRILL SOFT 3.0X3.8M (BURR) ×2 IMPLANT
CHLORAPREP W/TINT 26 (MISCELLANEOUS) ×3 IMPLANT
CNTNR SPEC 2.5X3XGRAD LEK (MISCELLANEOUS) ×1
CONT SPEC 4OZ STER OR WHT (MISCELLANEOUS) ×1
CONT SPEC 4OZ STRL OR WHT (MISCELLANEOUS) ×1
CONTAINER SPEC 2.5X3XGRAD LEK (MISCELLANEOUS) ×1 IMPLANT
COUNTER NEEDLE 20/40 LG (NEEDLE) ×2 IMPLANT
CUP MEDICINE 2OZ PLAST GRAD ST (MISCELLANEOUS) ×2 IMPLANT
DERMABOND ADVANCED (GAUZE/BANDAGES/DRESSINGS) ×1
DERMABOND ADVANCED .7 DNX12 (GAUZE/BANDAGES/DRESSINGS) ×1 IMPLANT
DRAPE C ARM PK CFD 31 SPINE (DRAPES) ×2 IMPLANT
DRAPE LAPAROTOMY 100X77 ABD (DRAPES) ×2 IMPLANT
DRAPE MICROSCOPE SPINE 48X150 (DRAPES) ×2 IMPLANT
DRAPE SURG 17X11 SM STRL (DRAPES) ×2 IMPLANT
DRSG OPSITE POSTOP 4X6 (GAUZE/BANDAGES/DRESSINGS) ×1 IMPLANT
DRSG TEGADERM 4X4.75 (GAUZE/BANDAGES/DRESSINGS) ×1 IMPLANT
ELECT CAUTERY BLADE TIP 2.5 (TIP) ×2
ELECT EZSTD 165MM 6.5IN (MISCELLANEOUS) ×2
ELECT REM PT RETURN 9FT ADLT (ELECTROSURGICAL) ×2
ELECTRODE CAUTERY BLDE TIP 2.5 (TIP) ×1 IMPLANT
ELECTRODE EZSTD 165MM 6.5IN (MISCELLANEOUS) ×1 IMPLANT
ELECTRODE REM PT RTRN 9FT ADLT (ELECTROSURGICAL) ×1 IMPLANT
GLOVE BIOGEL PI IND STRL 6.5 (GLOVE) ×1 IMPLANT
GLOVE BIOGEL PI INDICATOR 6.5 (GLOVE) ×1
GLOVE SURG SYN 6.5 ES PF (GLOVE) ×2 IMPLANT
GLOVE SURG SYN 6.5 PF PI (GLOVE) ×1 IMPLANT
GLOVE SURG SYN 8.5  E (GLOVE) ×3
GLOVE SURG SYN 8.5 E (GLOVE) ×3 IMPLANT
GLOVE SURG SYN 8.5 PF PI (GLOVE) ×3 IMPLANT
GLOVE SURG UNDER POLY LF SZ8.5 (GLOVE) ×2 IMPLANT
GOWN SRG LRG LVL 4 IMPRV REINF (GOWNS) ×1 IMPLANT
GOWN SRG XL LVL 3 NONREINFORCE (GOWNS) ×1 IMPLANT
GOWN STRL NON-REIN TWL XL LVL3 (GOWNS) ×2
GOWN STRL REIN LRG LVL4 (GOWNS) ×2
GRADUATE 1200CC STRL 31836 (MISCELLANEOUS) ×2 IMPLANT
GRAFT DURAGEN MATRIX 1WX1L (Tissue) ×1 IMPLANT
KIT SPINAL PRONEVIEW (KITS) ×2 IMPLANT
MANIFOLD NEPTUNE II (INSTRUMENTS) ×2 IMPLANT
MARKER SKIN DUAL TIP RULER LAB (MISCELLANEOUS) ×4 IMPLANT
NDL SAFETY ECLIPSE 18X1.5 (NEEDLE) ×1 IMPLANT
NEEDLE HYPO 18GX1.5 SHARP (NEEDLE) ×2
NEEDLE HYPO 22GX1.5 SAFETY (NEEDLE) ×2 IMPLANT
PACK LAMINECTOMY NEURO (CUSTOM PROCEDURE TRAY) ×2 IMPLANT
SURGIFLO W/THROMBIN 8M KIT (HEMOSTASIS) ×2 IMPLANT
SUT DVC VLOC 3-0 CL 6 P-12 (SUTURE) ×2 IMPLANT
SUT VIC AB 0 CT1 27 (SUTURE) ×2
SUT VIC AB 0 CT1 27XCR 8 STRN (SUTURE) ×1 IMPLANT
SUT VIC AB 2-0 CT1 18 (SUTURE) ×2 IMPLANT
SYR 30ML LL (SYRINGE) ×4 IMPLANT
SYR 3ML LL SCALE MARK (SYRINGE) ×2 IMPLANT
TOWEL OR 17X26 4PK STRL BLUE (TOWEL DISPOSABLE) ×6 IMPLANT
TUBING CONNECTING 10 (TUBING) ×3 IMPLANT

## 2021-08-30 NOTE — Discharge Instructions (Addendum)
?Your surgeon has performed an operation on your lumbar spine (low back) to relieve pressure on one or more nerves. Many times, patients feel better immediately after surgery and can ?overdo it.? Even if you feel well, it is important that you follow these activity guidelines. If you do not let your back heal properly from the surgery, you can increase the chance of a disc herniation and/or return of your symptoms. The following are instructions to help in your recovery once you have been discharged from the hospital. ? ?Activity  ?  ?No bending, lifting, or twisting (?BLT?). Avoid lifting objects heavier than 10 pounds (gallon milk jug).  Where possible, avoid household activities that involve lifting, bending, pushing, or pulling such as laundry, vacuuming, grocery shopping, and childcare. Try to arrange for help from friends and family for these activities while your back heals. ? ?Increase physical activity slowly as tolerated.  Taking short walks is encouraged, but avoid strenuous exercise. Do not jog, run, bicycle, lift weights, or participate in any other exercises unless specifically allowed by your doctor. Avoid prolonged sitting, including car rides. ? ?Talk to your doctor before resuming sexual activity. ? ?You should not drive until cleared by your doctor. ? ?Until released by your doctor, you should not return to work or school.  You should rest at home and let your body heal.  ? ?You may shower two days after your surgery.  After showering, lightly dab your incision dry. Do not take a tub bath or go swimming for 3 weeks, or until approved by your doctor at your follow-up appointment. ? ?If you smoke, we strongly recommend that you quit.  Smoking has been proven to interfere with normal healing in your back and will dramatically reduce the success rate of your surgery. Please contact [facility] (800-QUIT-NOW) and use the resources at www.[facility].com for assistance in stopping smoking. ? ?Surgical  Incision ?  ?If you have a dressing on your incision, you may remove it three days after your surgery. Keep your incision area clean and dry. ? ?Your incision was closed with Dermabond glue. The glue should begin to peel away within about a week. ? ?Diet          ? ? You may return to your usual diet. Be sure to stay hydrated. ? ?When to Contact us ? ?Although your surgery and recovery will likely be uneventful, you may have some residual numbness, aches, and pains in your back and/or legs. This is normal and should improve in the next few weeks. ? ?However, should you experience any of the following, contact us immediately: ?New numbness or weakness ?Pain that is progressively getting worse, and is not relieved by your pain medications or rest ?Bleeding, redness, swelling, pain, or drainage from surgical incision ?Chills or flu-like symptoms ?Fever greater than 101.0 F (38.3 C) ?Problems with bowel or bladder functions ?Difficulty breathing or shortness of breath ?Warmth, tenderness, or swelling in your calf ? ?Contact Information ?During office hours (Monday-Friday 9 am to 5 pm), please call your physician at [phone] ?After hours and weekends, please call [phone] and speak with the answering service, who will contact the doctor on call.  If that fails, call the [facility] Operator at [phone] and ask for the Neurosurgery Resident On Call  ?For a life-threatening emergency, call 911 AMBULATORY SURGERY  ?DISCHARGE INSTRUCTIONS ? ? ?The drugs that you were given will stay in your system until tomorrow so for the next 24 hours you should not: ? ?Drive  an automobile ?Make any legal decisions ?Drink any alcoholic beverage ? ? ?You may resume regular meals tomorrow.  Today it is better to start with liquids and gradually work up to solid foods. ? ?You may eat anything you prefer, but it is better to start with liquids, then soup and crackers, and gradually work up to solid foods. ? ? ?Please notify your doctor  immediately if you have any unusual bleeding, trouble breathing, redness and pain at the surgery site, drainage, fever, or pain not relieved by medication. ?  ? ?Your post-operative visit with Dr.                     ? ? ?           ?     is: Date:                        Time:   ? ?Please call to schedule your post-operative visit. ? ?Additional Instructions:  ?

## 2021-08-30 NOTE — Op Note (Signed)
Indications: [name] is a 41 yo female who presented with: ?m54.16 lumbar radiculopathy ? ?She failed conservative management prompting surgical intervention. ? ?Findings: disc herniation ? ?Preoperative Diagnosis: Lumbar radiculopathy ?Postoperative Diagnosis: same ? ? ?EBL: 25 ml ?IVF: see AR ml ?Drains: none ?Disposition: Extubated and Stable to PACU ?Complications: none ? ?No foley catheter was placed. ? ? ?Preoperative Note:  ? ?Risks of surgery discussed include: infection, bleeding, stroke, coma, death, paralysis, CSF leak, nerve/spinal cord injury, numbness, tingling, weakness, complex regional pain syndrome, recurrent stenosis and/or disc herniation, vascular injury, development of instability, neck/back pain, need for further surgery, persistent symptoms, development of deformity, and the risks of anesthesia. The patient understood these risks and agreed to proceed. ? ?Operative Note:  ? ?1) Right L5/S1 microdiscectomy ?2) Right L4/5 laminoforaminotomy ? ?The patient was then brought from the preoperative center with intravenous access established.  The patient underwent general anesthesia and endotracheal tube intubation, and was then rotated on the Henriette rail top where all pressure points were appropriately padded.  The skin was then thoroughly cleansed.  Perioperative antibiotic prophylaxis was administered.  Sterile prep and drapes were then applied and a timeout was then observed.  C-arm was brought into the field under sterile conditions, and the L5-S1 disc space identified and marked with an incision on the right 1cm lateral to midline. ? ?Once this was complete a 4 cm incision was opened with the use of a #10 blade knife.  The Metrx tubes were sequentially advanced under lateral fluoroscopy until a 18 x 70 mm Metrx tube was placed over the facet and lamina and secured to the bed.   ? ?The microscope was then sterilely brought into the field and muscle creep was hemostased with a bipolar and  resected with a pituitary rongeur.  A Bovie extender was then used to expose the spinous process and lamina.  Careful attention was placed to not violate the facet capsule. A 3 mm matchstick drill bit was then used to make a hemi-laminotomy trough until the ligamentum flavum was exposed.  This was extended to the base of the spinous process.  Once this was complete and the underlying ligamentum flavum was visualized this was dissected with an up angle curette and resected with a #2 and #3 mm biting Kerrison.  The laminotomy opening was also expanded in similar fashion and hemostasis was obtained with Surgifoam and a patty as well as bone wax.  The rostral aspect of the caudal level of the lamina was also resected with a #2 biting Kerrison effort to further enhance exposure.  Once the underlying dura was visualized a Penfield 4 was then used to dissect and expose the traversing nerve root.  Once this was identified a nerve root retractor suction was used to mobilize this medially.  The venous plexus was hemostased with Surgifoam and light bipolar use.  A small penfied was then used to make a small annulotomy within the disc space and disc space contents were noted to come through the annulus.   ? ?The disc herniation was identified and dissected free using a balltip probe. The pituitary rongeur was used to remove the extruded disc fragments. Once the thecal sac and nerve root were noted to be relaxed and under less tension the ball-tipped feeler was passed along the foramen distally to to ensure no residual compression was noted.   ? ?  The area was irrigated. The tube system was then removed under microscopic visualization and hemostasis was obtained with a  bipolar.   ? ?After performing the decompression at L5-S1, the metrx tubes were sequentially advanced and confirmed in position at L4-5. An 1mm by 46mm tube was locked in place to the bed side attachment.  Fluoroscopy was then removed from the field.  The  microscope was then sterilely brought into the field and muscle creep was hemostased with a bipolar and resected with a pituitary rongeur.  A Bovie extender was then used to expose the spinous process and lamina.  Careful attention was placed to not violate the facet capsule. A 3 mm matchstick drill bit was then used to make a hemi-laminotomy trough until the ligamentum flavum was exposed.  This was extended to the base of the spinous process and to the contralateral side to remove all the central bone from each side.  Once this was complete and the underlying ligamentum flavum was visualized, it was dissected with a curette and resected with Kerrison rongeurs.  Extensive ligamentum hypertrophy was noted, requiring a substantial amount of time and care for removal.  The dura was identified and palpated. The kerrison rongeur was then used to remove the medial facet bilaterally until no compression was noted.  A balltip probe was used to confirm decompression of the ipsilateral L5 nerve root. ? ?Decompression on both sides was confirmed. No CSF leak was noted. ? ?A Depo-Medrol soaked Gelfoam pledget was placed in the defect.  The wound was copiously irrigated. The tube system was then removed under microscopic visualization and hemostasis was obtained with a bipolar.   ? ? ?The fascial layer was reapproximated with the use of a 0- Vicryl suture.  Subcutaneous tissue layer was reapproximated using 2-0 Vicryl suture.  3-0 monocryl was used on the skin. The skin was then cleansed and Dermabond was used to close the skin opening.  Patient was then rotated back to the preoperative bed awakened from anesthesia and taken to recovery all counts are correct in this case. ? ? ?I performed the entire procedure with the assistance of Manning Charity PA as an Designer, television/film set. ? ?Venetia Night MD ? ?

## 2021-08-30 NOTE — Discharge Summary (Signed)
Physician Discharge Summary  ?Patient ID: ?Melissa Black [name] ?MRN: [id number] ?DOB/AGE: Dec 01, 1980 41 y.o. ? ?Admit date: 08/30/2021 ?Discharge date: 08/31/2021 ? ?Admission Diagnoses: ?m54.16 lumbar radiculopathy ? ?Discharge Diagnoses:  ?Active Problems: ?  * No active hospital problems. * ? ? ?Discharged Condition: good ? ?Hospital Course:  ?Melissa Black [facility] is a 41 y.o s/p right L5-S1 microdiscectomy and right L4-5 laminoforaminotomy on 08/30/21. Her interoperative course was uncomplicated. She was monitored post-op in PACU and discharged home after ambulating, urinating, and tolerating PO intake. She was given prescriptions for pain medication and muscle relaxer.  ? ?Consults:  none ? ?Significant Diagnostic Studies: none  ? ?Treatments: surgery: as above. Please see separately dictated operative report for further details ? ?Discharge Exam: ?Blood pressure (!) 118/91, pulse 64, temperature 98 ?F (36.7 ?C), temperature source Temporal, resp. rate 16, height 5\' 7"  (1.702 m), weight 91 kg, SpO2 100 %. ?CN II-XII grossly intact ?5/5 throughout BLE ?Incision c/d/I with post-op dressing in place ? ?Disposition: Discharge disposition: 01-Home or Self Care ? ? ? ? ? ? ? ?Allergies as of 08/30/2021   ? ?   Reactions  ? Codeine Swelling  ? Penicillins Swelling, Other (See Comments)  ? TOLERATED CEFAZOLIN ?Has patient had a PCN reaction causing immediate rash, facial/tongue/throat swelling, SOB or lightheadedness with hypotension: yes ?Has patient had a PCN reaction causing severe rash involving mucus membranes or skin necrosis: no ?Has patient had a PCN reaction that required hospitalization no ?Has patient had a PCN reaction occurring within the last 10 years: no ?If all of the above answers are "NO", then may proceed with Cephalosporin use.  ? Benadryl [diphenhydramine] Hives  ? Bupropion Palpitations  ? Heart palpitations.  ? ?  ? ?  ?Medication List  ?  ? ?TAKE these medications   ? ?albuterol 108 (90 Base) MCG/ACT  inhaler ?Commonly known as: VENTOLIN HFA ?Inhale 1-2 puffs into the lungs every 6 (six) hours as needed for wheezing or shortness of breath. ?  ?DULoxetine 60 MG capsule ?Commonly known as: CYMBALTA ?Take 60 mg by mouth in the morning. ?  ?medroxyPROGESTERone 150 MG/ML injection ?Commonly known as: DEPO-PROVERA ?Inject 150 mg into the muscle every 3 (three) months. ?  ?methocarbamol 750 MG tablet ?Commonly known as: Robaxin-750 ?Take 1 tablet (750 mg total) by mouth every 8 (eight) hours as needed for muscle spasms. ?  ?multivitamin with minerals Tabs tablet ?Take 1 tablet by mouth in the morning. One A Day Multivitamin for Women ?  ?oxyCODONE 5 MG immediate release tablet ?Commonly known as: Roxicodone ?Take 1 tablet (5 mg total) by mouth every 4 (four) hours as needed for up to 5 days for severe pain. ?  ?senna 8.6 MG Tabs tablet ?Commonly known as: SENOKOT ?Take 1 tablet (8.6 mg total) by mouth daily as needed for mild constipation. ?  ? ?  ? ? Follow-up Information   ? ? 10/30/2021, PA Follow up in 2 week(s).   ?Why: For post-op and incision check. This appointment date and time should be on your pre-op paperwork., follow up scheduled for May 25th @ 3:30pm ?Contact information: ?[address] ?[place] ?[phone] ? ? ?  ?  ? ?  ?  ? ?  ? ? ?Signed: ?[phone] ?08/31/2021, 1:35 PM ? ? ?

## 2021-08-30 NOTE — H&P (Signed)
I have reviewed and confirmed my history and physical from 08/12/2021 with no additions or changes. Plan for R L4-5 laminoforaminotomy and R L5-S1 discectomy.  Risks and benefits reviewed. ? ?Heart sounds normal no MRG. Chest Clear to Auscultation Bilaterally. ? ? ?  ? ?

## 2021-08-30 NOTE — Anesthesia Procedure Notes (Signed)
Procedure Name: Intubation ?Date/Time: 08/30/2021 9:05 AM ?Performed by: Demetrius Charity, CRNA ?Pre-anesthesia Checklist: Patient identified, Patient being monitored, Timeout performed, Emergency Drugs available and Suction available ?Patient Re-evaluated:Patient Re-evaluated prior to induction ?Oxygen Delivery Method: Circle system utilized ?Preoxygenation: Pre-oxygenation with 100% oxygen ?Induction Type: IV induction ?Ventilation: Mask ventilation without difficulty ?Laryngoscope Size: 3 and McGraph ?Grade View: Grade I ?Tube type: Oral ?Tube size: 6.5 mm ?Number of attempts: 1 ?Airway Equipment and Method: Stylet ?Placement Confirmation: ETT inserted through vocal cords under direct vision, positive ETCO2 and breath sounds checked- equal and bilateral ?Secured at: 22 cm ?Tube secured with: Tape ?Dental Injury: Teeth and Oropharynx as per pre-operative assessment  ? ? ? ? ?

## 2021-08-30 NOTE — Progress Notes (Signed)
Patient more awake, alert, moving all ext  States RLE with "nerve pain"  noted foot drop, patient states she is hoping surgery will help her be painfree. Able to bend bil knee's, push/pull  hand grasps intact.  Verbalizes understanding that procedure complete.  ?

## 2021-08-30 NOTE — Transfer of Care (Signed)
Immediate Anesthesia Transfer of Care Note ? ?Patient: Melissa Black ? ?Procedure(s) Performed: RIGHT L4-5 LAMINOFORAMINOTOMY, RIGHT L5-S1 MICRODISCECTOMY (Right: Spine Lumbar) ? ?Patient Location: PACU ? ?Anesthesia Type:General ? ?Level of Consciousness: drowsy ? ?Airway & Oxygen Therapy: Patient Spontanous Breathing and Patient connected to face mask oxygen ? ?Post-op Assessment: Report given to RN and Post -op Vital signs reviewed and stable ? ?Post vital signs: Reviewed and stable ? ?Last Vitals:  ?Vitals Value Taken Time  ?BP 101/77 08/30/21 1050  ?Temp 36.1 ?C 08/30/21 1050  ?Pulse 64 08/30/21 1056  ?Resp 14 08/30/21 1056  ?SpO2 100 % 08/30/21 1056  ?Vitals shown include unvalidated device data. ? ?Last Pain:  ?Vitals:  ? 08/30/21 1050  ?TempSrc:   ?PainSc: Asleep  ?   ? ?  ? ?Complications: No notable events documented. ?

## 2021-08-30 NOTE — Anesthesia Postprocedure Evaluation (Signed)
Anesthesia Post Note ? ?Patient: Melissa Black ? ?Procedure(s) Performed: RIGHT L4-5 LAMINOFORAMINOTOMY, RIGHT L5-S1 MICRODISCECTOMY (Right: Spine Lumbar) ? ?Patient location during evaluation: PACU ?Anesthesia Type: General ?Level of consciousness: awake and alert ?Pain management: pain level controlled ?Vital Signs Assessment: post-procedure vital signs reviewed and stable ?Respiratory status: spontaneous breathing, nonlabored ventilation, respiratory function stable and patient connected to nasal cannula oxygen ?Cardiovascular status: blood pressure returned to baseline and stable ?Postop Assessment: no apparent nausea or vomiting ?Anesthetic complications: no ? ? ?No notable events documented. ? ? ?Last Vitals:  ?Vitals:  ? 08/30/21 1130 08/30/21 1159  ?BP: 108/80 106/82  ?Pulse: 66 66  ?Resp: 16 (!) 1  ?Temp:  36.5 ?C  ?SpO2: 100% 100%  ?  ?Last Pain:  ?Vitals:  ? 08/30/21 1159  ?TempSrc:   ?PainSc: 2   ? ? ?  ?  ?  ?  ?  ?  ? ?Arita Miss ? ? ? ? ?

## 2021-08-30 NOTE — Progress Notes (Signed)
Pt ambulated in hall.  Pt reports standing straighter than prior to surgery and able to walk better on right leg.  Has eaten graham crackers without difficulty and urinated.   ?

## 2021-08-30 NOTE — Anesthesia Preprocedure Evaluation (Signed)
Anesthesia Evaluation  ?Patient identified by MRN, date of birth, ID band ?Patient awake ? ? ? ?Reviewed: ?Allergy & Precautions, NPO status , Patient's Chart, lab work & pertinent test results ? ?History of Anesthesia Complications ?Negative for: history of anesthetic complications ? ?Airway ?Mallampati: II ? ?TM Distance: >3 FB ?Neck ROM: Full ? ? ? Dental ?no notable dental hx. ?(+) Teeth Intact ?  ?Pulmonary ?asthma , neg sleep apnea, neg COPD, Patient abstained from smoking.Not current smoker, former smoker,  ?  ?Pulmonary exam normal ?breath sounds clear to auscultation ? ? ? ? ? ? Cardiovascular ?Exercise Tolerance: Good ?METS(-) hypertension(-) CAD and (-) Past MI negative cardio ROS ? ?(-) dysrhythmias  ?Rhythm:Regular Rate:Normal ?- Systolic murmurs ? ?  ?Neuro/Psych ?PSYCHIATRIC DISORDERS Anxiety negative neurological ROS ?   ? GI/Hepatic ?neg GERD  ,(+)  ?  ? (-) substance abuse ? ,   ?Endo/Other  ?neg diabetes ? Renal/GU ?negative Renal ROS  ? ?  ?Musculoskeletal ? ? Abdominal ?  ?Peds ? Hematology ?  ?Anesthesia Other Findings ?Past Medical History: ?No date: ADHD (attention deficit hyperactivity disorder) ?No date: Anemia ?    Comment:  h/o ?No date: Anxiety ?No date: Asthma ?No date: Chest pain, unspecified ?No date: Genital herpes ?No date: History of chlamydia ?No date: History of gonorrhea ?No date: History of marijuana use ?No date: Obesity ?No date: SOB (shortness of breath) ?No date: Spinal stenosis of lumbar region with radiculopathy ? Reproductive/Obstetrics ? ?  ? ? ? ? ? ? ? ? ? ? ? ? ? ?  ?  ? ? ? ? ? ? ? ? ?Anesthesia Physical ?Anesthesia Plan ? ?ASA: 2 ? ?Anesthesia Plan: General  ? ?Post-op Pain Management: Ofirmev IV (intra-op)* and Dilaudid IV  ? ?Induction: Intravenous ? ?PONV Risk Score and Plan: 4 or greater and Ondansetron, Dexamethasone and Midazolam ? ?Airway Management Planned: Oral ETT ? ?Additional Equipment: None ? ?Intra-op Plan:   ? ?Post-operative Plan: Extubation in OR ? ?Informed Consent: I have reviewed the patients History and Physical, chart, labs and discussed the procedure including the risks, benefits and alternatives for the proposed anesthesia with the patient or authorized representative who has indicated his/her understanding and acceptance.  ? ? ? ?Dental advisory given ? ?Plan Discussed with: CRNA and Surgeon ? ?Anesthesia Plan Comments: (Discussed risks of anesthesia with patient, including PONV, sore throat, lip/dental/eye damage. Rare risks discussed as well, such as cardiorespiratory and neurological sequelae, and allergic reactions. Discussed the role of CRNA in patient's perioperative care. Patient understands. ?Patient has listed allergy to PCN - body swelling, hard to breathe. Happened "couple of years ago" ?Severe blistering skin reaction (SJS/TEN)? no ?Liver or kidney injury caused by PCN? no ?Hemolytic anemia from PCN? no ?Drug fever? no ?Painful swollen joints? no ?Severe reaction involving inside of mouth, eye, or genital ulcers? no ?Based on current evidence Elizebeth Koller et al, J Allergy Clin Immunol Pract, 2019), will proceed with cefazolin use: Yes  ?)  ? ? ? ? ? ? ?Anesthesia Quick Evaluation ? ?

## 2021-08-31 ENCOUNTER — Encounter: Payer: Self-pay | Admitting: Neurosurgery

## 2021-10-08 ENCOUNTER — Other Ambulatory Visit: Payer: Self-pay | Admitting: Neurosurgery

## 2021-10-08 DIAGNOSIS — M5416 Radiculopathy, lumbar region: Secondary | ICD-10-CM

## 2021-10-08 DIAGNOSIS — R29898 Other symptoms and signs involving the musculoskeletal system: Secondary | ICD-10-CM

## 2021-10-24 ENCOUNTER — Emergency Department: Payer: Medicaid Other

## 2021-10-24 ENCOUNTER — Encounter: Payer: Self-pay | Admitting: Emergency Medicine

## 2021-10-24 ENCOUNTER — Other Ambulatory Visit: Payer: Self-pay

## 2021-10-24 ENCOUNTER — Emergency Department
Admission: EM | Admit: 2021-10-24 | Discharge: 2021-10-24 | Disposition: A | Discharge status: Home / Self Care | Payer: Medicaid Other | Attending: Emergency Medicine | Admitting: Emergency Medicine

## 2021-10-24 DIAGNOSIS — M5416 Radiculopathy, lumbar region: Secondary | ICD-10-CM

## 2021-10-24 DIAGNOSIS — M545 Low back pain, unspecified: Secondary | ICD-10-CM

## 2021-10-24 DIAGNOSIS — G8929 Other chronic pain: Secondary | ICD-10-CM

## 2021-10-24 DIAGNOSIS — M79604 Pain in right leg: Secondary | ICD-10-CM | POA: Diagnosis present

## 2021-10-24 LAB — CBC WITH DIFFERENTIAL/PLATELET
Abs Immature Granulocytes: 0.05 10*3/uL (ref 0.00–0.07)
Basophils Absolute: 0 10*3/uL (ref 0.0–0.1)
Basophils Relative: 0 %
Eosinophils Absolute: 0 10*3/uL (ref 0.0–0.5)
Eosinophils Relative: 0 %
HCT: 39.8 % (ref 36.0–46.0)
Hemoglobin: 13.1 g/dL (ref 12.0–15.0)
Immature Granulocytes: 0 %
Lymphocytes Relative: 16 %
Lymphs Abs: 2.2 10*3/uL (ref 0.7–4.0)
MCH: 29.7 pg (ref 26.0–34.0)
MCHC: 32.9 g/dL (ref 30.0–36.0)
MCV: 90.2 fL (ref 80.0–100.0)
Monocytes Absolute: 0.8 10*3/uL (ref 0.1–1.0)
Monocytes Relative: 6 %
Neutro Abs: 10.3 10*3/uL — ABNORMAL HIGH (ref 1.7–7.7)
Neutrophils Relative %: 78 %
Platelets: 334 10*3/uL (ref 150–400)
RBC: 4.41 MIL/uL (ref 3.87–5.11)
RDW: 13.2 % (ref 11.5–15.5)
WBC: 13.4 10*3/uL — ABNORMAL HIGH (ref 4.0–10.5)
nRBC: 0 % (ref 0.0–0.2)

## 2021-10-24 LAB — URINALYSIS, ROUTINE W REFLEX MICROSCOPIC
Bilirubin Urine: NEGATIVE
Glucose, UA: NEGATIVE mg/dL
Hgb urine dipstick: NEGATIVE
Ketones, ur: NEGATIVE mg/dL
Leukocytes,Ua: NEGATIVE
Nitrite: NEGATIVE
Protein, ur: NEGATIVE mg/dL
Specific Gravity, Urine: 1.023 (ref 1.005–1.030)
pH: 7 (ref 5.0–8.0)

## 2021-10-24 LAB — BASIC METABOLIC PANEL
Anion gap: 8 (ref 5–15)
BUN: 16 mg/dL (ref 6–20)
CO2: 23 mmol/L (ref 22–32)
Calcium: 8.8 mg/dL — ABNORMAL LOW (ref 8.9–10.3)
Chloride: 110 mmol/L (ref 98–111)
Creatinine, Ser: 1 mg/dL (ref 0.44–1.00)
GFR, Estimated: >60 mL/min (ref >60)
Glucose, Bld: 107 mg/dL — ABNORMAL HIGH (ref 70–99)
Potassium: 4 mmol/L (ref 3.5–5.1)
Sodium: 141 mmol/L (ref 135–145)

## 2021-10-24 LAB — POC URINE PREG, ED: Preg Test, Ur: NEGATIVE

## 2021-10-24 MED ORDER — METHYLPREDNISOLONE SODIUM SUCC 125 MG IJ SOLR
125.0000 mg | Freq: Once | INTRAMUSCULAR | Status: AC
  Administered 2021-10-24: 125 mg via INTRAVENOUS
  Filled 2021-10-24: qty 2

## 2021-10-24 MED ORDER — ONDANSETRON HCL 4 MG/2ML IJ SOLN
4.0000 mg | Freq: Once | INTRAMUSCULAR | Status: AC
  Administered 2021-10-24: 4 mg via INTRAVENOUS
  Filled 2021-10-24: qty 2

## 2021-10-24 MED ORDER — TIZANIDINE HCL 4 MG PO TABS: 4.0000 mg | ORAL_TABLET | Freq: Three times a day (TID) | ORAL | 0 refills | Status: DC

## 2021-10-24 MED ORDER — GABAPENTIN 300 MG PO CAPS
600.0000 mg | ORAL_CAPSULE | Freq: Once | ORAL | Status: AC
  Administered 2021-10-24: 600 mg via ORAL
  Filled 2021-10-24: qty 2

## 2021-10-24 MED ORDER — TIZANIDINE HCL 2 MG PO TABS
4.0000 mg | ORAL_TABLET | Freq: Once | ORAL | Status: AC
  Administered 2021-10-24: 4 mg via ORAL
  Filled 2021-10-24: qty 2

## 2021-10-24 MED ORDER — PREDNISONE 20 MG PO TABS: ORAL_TABLET | ORAL | 0 refills | Status: AC

## 2021-10-24 MED ORDER — GABAPENTIN 600 MG PO TABS: 600.0000 mg | ORAL_TABLET | Freq: Three times a day (TID) | ORAL | 0 refills | Status: DC

## 2021-10-24 MED ORDER — HYDROMORPHONE HCL 1 MG/ML IJ SOLN
1.0000 mg | Freq: Once | INTRAMUSCULAR | Status: AC
  Administered 2021-10-24: 1 mg via INTRAVENOUS
  Filled 2021-10-24: qty 1

## 2021-10-24 MED ORDER — ORPHENADRINE CITRATE 30 MG/ML IJ SOLN: 60.0000 mg | INTRAMUSCULAR | Status: DC

## 2021-10-24 MED ORDER — GADOBUTROL 1 MMOL/ML IV SOLN
9.0000 mL | Freq: Once | INTRAVENOUS | Status: AC | PRN
  Administered 2021-10-24: 10 mL via INTRAVENOUS

## 2021-10-24 MED ORDER — KETOROLAC TROMETHAMINE 30 MG/ML IJ SOLN
15.0000 mg | Freq: Once | INTRAMUSCULAR | Status: AC
Start: 2021-10-24 — End: 2021-10-24
  Administered 2021-10-24: 15 mg via INTRAVENOUS
  Filled 2021-10-24: qty 1

## 2021-10-24 MED ORDER — OXYCODONE HCL 5 MG PO TABS
5.0000 mg | ORAL_TABLET | Freq: Three times a day (TID) | ORAL | 0 refills | Status: DC | PRN
Start: 2021-10-24 — End: 2021-10-29

## 2021-10-24 NOTE — ED Triage Notes (Signed)
Pt reports had back surgery on 5/8, has followed up several times post surgery but is having some pain still from her back all the way down her right leg. Pt reports told her MD each time she went and he ordered her a MRI but she can't wait until 7/5 because that is to long and the pain is too much.

## 2021-10-24 NOTE — ED Notes (Signed)
Pt assisted to bathroom in wheelchair. Instructed on call bell

## 2021-10-24 NOTE — ED Notes (Signed)
Pt to ED for back pain, pt had back surgery may 11th- L4-L5 laminoforaminotomy and L5-S1 microdiscectomy. Pt prescribed gabapentin, steroids, and methocarbomol without any relief. Pt unable to ambulate and states she is not able to do ADLs without being in intense pain.

## 2021-10-24 NOTE — ED Triage Notes (Signed)
First Nurse Note;  Pt via EMS from home. Pt c/o lower back pain, states she had surgery May 8th, states she still in a lot of pain since then. Pt states is unable to walk. Pt takes gabapentin and muscles relaxer. Pt is A&Ox4 and NAD  135/108 100% on RA  99 HR  142 CBG

## 2021-10-24 NOTE — ED Provider Notes (Signed)
----------------------------------------- 8:01 PM on 10/24/2021 -----------------------------------------  Blood pressure 115/72, pulse 82, temperature 98.6 F (37 C), temperature source Oral, resp. rate 20, height 5\' 7"  (1.702 m), weight 89.8 kg, last menstrual period 09/21/2021, SpO2 96 %.  Assuming care from Dr. 09/23/2021. In short, Melissa Black is a 41 y.o. adult with a chief complaint of Back Pain .  Refer to the original H&P for additional details.  The current plan of care is to await MRI results and disposition appropriately.  If findings are benign patient to be discharged home with pain management and steroid taper to follow-up with her neurologist.  Discuss the case with neurology for any pertinent findings.  ____________________________________________   LABS (pertinent positives/negatives)  Labs Reviewed  URINALYSIS, ROUTINE W REFLEX MICROSCOPIC - Abnormal; Notable for the following components:      Result Value   Color, Urine YELLOW (*)    APPearance CLEAR (*)    All other components within normal limits  CBC WITH DIFFERENTIAL/PLATELET - Abnormal; Notable for the following components:   WBC 13.4 (*)    Neutro Abs 10.3 (*)    All other components within normal limits  BASIC METABOLIC PANEL - Abnormal; Notable for the following components:   Glucose, Bld 107 (*)    Calcium 8.8 (*)    All other components within normal limits  POC URINE PREG, ED  ____________________________________________   RADIOLOGY MR Lumbar Spine W Wo Contrast  Result Date: 10/24/2021 CLINICAL DATA:  Low back pain. Previous lumbar surgery with new right lower extremity radiculopathy. EXAM: MRI LUMBAR SPINE WITHOUT AND WITH CONTRAST TECHNIQUE: Multiplanar and multiecho pulse sequences of the lumbar spine were obtained without and with intravenous contrast. CONTRAST:  66mL GADAVIST GADOBUTROL 1 MMOL/ML IV SOLN COMPARISON:  MRI of the lumbar spine 06/22/2021 FINDINGS: Segmentation: 5 non  rib-bearing lumbar type vertebral bodies are present. The lowest fully formed vertebral body is L5. Alignment: No significant listhesis is present. Mild rightward curvature is centered at L3-4. Vertebrae: Mild edematous endplate changes are present at L5-S1, stable. Marrow signal and vertebral body heights are otherwise normal. Conus medullaris and cauda equina: Conus extends to the L1-2 level. Conus and cauda equina appear normal. Paraspinal and other soft tissues: Limited imaging the abdomen is unremarkable. There is no significant adenopathy. No solid organ lesions are present. Disc levels: L1-2: Mild disc bulging and facet hypertrophy is present without significant stenosis. L2-3: Mild disc bulging and moderate facet hypertrophy is present. No significant stenosis is present. L3-4: A broad-based disc bulge extends into the foramina. Moderate facet hypertrophy is present. No significant stenosis is present. L4-5: Right laminectomy is present. The central canal is decompressed. Enhancing granulation tissue is present. The foramina are patent bilaterally. L5-S1: Right laminectomy present. Residual disc material present centrally with continued displacement of the nerve roots. Moderate foraminal stenosis remains bilaterally. IMPRESSION: 1. Right laminectomy at L4-5 and L5-S1 with continued displacement of the nerve roots at L5-S1 but no definite compression. 2. Moderate foraminal stenosis bilaterally at L5-S1 is stable. 3. Mild disc bulging and facet hypertrophy at L1-2 and L2-3 without significant stenosis. 4. No acute or focal lesion to explain the patient's right lower extremity symptoms. Electronically Signed   By: 06/24/2021 M.D.   On: 10/24/2021 20:05   ____________________________________________  PROCEDURES  Tizanidine 10 mg PO Gabapentin 600 mg PO Solumedrol 125 mg IVP Hydromorphone 1 mg IVP   Procedures ____________________________________________  INITIAL IMPRESSION / ASSESSMENT  AND PLAN / ED COURSE   -----------------------------------------  9:38 PM on 10/24/2021 ----------------------------------------- Discussed the case with Dr. 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  This continues to cause the patient's radicular symptoms.  No acute findings on MRI with her overall stable exam.  Patient to the ED for evaluation of continued right lower extremity radicular symptoms 2 months status post lumbar laminectomy.  Patient's MRI is overall reassuring exam without any acute findings or signs of follow-up cord infection.  Patient was significant improvement of her symptomology after multiple doses of IV medication.  She be discharged with instructions to increase her dose of gabapentin to 3 times a day dosing with a goal to be at least 600 mg per dose.  Her most Flexeril be changed to tizanidine at this time.  A small prescription for oxycodone was provided for her benefit.  A an additional steroid taper was also provided.  Patient will follow-up with Dr. Volanda Napoleon for reevaluation following this emergent MRI.  I reviewed the patient's prescription history over the last 12 months in the multi-state controlled substances database(s) that includes [place], [place], [place], [place], [place], [place], [place], [place], [place], [place], [place], [place], [place], and [place].  Results were notable for no current RX. ____________________________________________  FINAL CLINICAL IMPRESSION(S) / ED DIAGNOSES  Final diagnoses:  Chronic midline low back pain, unspecified whether sciatica present  Lumbar radiculopathy      Melissa Black, Charlesetta Ivory, PA-C 10/24/21 2340    Shaune Pollack, MD 10/27/21 1102

## 2021-10-24 NOTE — ED Notes (Signed)
The pt provided a urine which was sent down.

## 2021-10-24 NOTE — ED Provider Notes (Signed)
[facility] Provider Note    Event Date/Time   First MD Initiated Contact with Patient 10/24/21 1641     (approximate)   History   Back Pain   HPI  Melissa Black is a 41 y.o. adult with past medical history of lumbar discectomy in March with Dr. Marcell Barlow here with ongoing, severe, right leg and back pain.  The patient states that since her surgery, she has had fairly constant, though progressively worsening, right leg pain.  She states that the pain is a stabbing, burning sensation that shoots down her right leg, similar to the radiculopathy she had been having previously.  She states that this has been increasingly severe and she is actually scheduled to have an MRI as an outpatient.  Over the last day, this pain is progressively worsened and she feels like she cannot walk due to the pain.  She has been taking over-the-counter meds and muscle relaxants without significant improvement.  Denies any loss of bowel or bladder function.  Denies any weakness.  Denies any fevers or chills.     Physical Exam   Triage Vital Signs: ED Triage Vitals  Enc Vitals Group     BP 10/24/21 1540 115/72     Pulse Rate 10/24/21 1540 82     Resp 10/24/21 1540 20     Temp 10/24/21 1540 98.6 F (37 C)     Temp Source 10/24/21 1540 Oral     SpO2 10/24/21 1540 96 %     Weight 10/24/21 1539 198 lb (89.8 kg)     Height 10/24/21 1539 5\' 7"  (1.702 m)     Head Circumference --      Peak Flow --      Pain Score 10/24/21 1538 10     Pain Loc --      Pain Edu? --      Excl. in GC? --     Most recent vital signs: Vitals:   10/24/21 2030 10/24/21 2100  BP: 99/66 97/65  Pulse: 65 67  Resp: 17 17  Temp:    SpO2: 97% 96%     General: Awake, no distress.  CV:  Good peripheral perfusion.  Resp:  Normal effort.  Abd:  No distention.  Other:  Moderate tenderness over the right paraspinal lumbar spine.  Surgical site appears clean, dry, with no erythema.  Minimal  midline tenderness.  Strength 5 out of 5 bilateral lower extremities although she has significant pain with any range of motion of the right leg.  Normal sensation light touch distally.   ED Results / Procedures / Treatments   Labs (all labs ordered are listed, but only abnormal results are displayed) Labs Reviewed  URINALYSIS, ROUTINE W REFLEX MICROSCOPIC - Abnormal; Notable for the following components:      Result Value   Color, Urine YELLOW (*)    APPearance CLEAR (*)    All other components within normal limits  CBC WITH DIFFERENTIAL/PLATELET - Abnormal; Notable for the following components:   WBC 13.4 (*)    Neutro Abs 10.3 (*)    All other components within normal limits  BASIC METABOLIC PANEL - Abnormal; Notable for the following components:   Glucose, Bld 107 (*)    Calcium 8.8 (*)    All other components within normal limits  POC URINE PREG, ED     EKG    RADIOLOGY MRI: Pending   I also independently reviewed and agree with radiologist interpretations.  PROCEDURES:  Critical Care performed: No   MEDICATIONS ORDERED IN ED: Medications  HYDROmorphone (DILAUDID) injection 1 mg (1 mg Intravenous Given 10/24/21 1730)  ondansetron (ZOFRAN) injection 4 mg (4 mg Intravenous Given 10/24/21 1726)  ketorolac (TORADOL) 30 MG/ML injection 15 mg (15 mg Intravenous Given 10/24/21 1730)  HYDROmorphone (DILAUDID) injection 1 mg (1 mg Intravenous Given 10/24/21 1922)  gadobutrol (GADAVIST) 1 MMOL/ML injection 9 mL (10 mLs Intravenous Contrast Given 10/24/21 1938)  methylPREDNISolone sodium succinate (SOLU-MEDROL) 125 mg/2 mL injection 125 mg (125 mg Intravenous Given 10/24/21 2114)  gabapentin (NEURONTIN) capsule 600 mg (600 mg Oral Given 10/24/21 2206)  tiZANidine (ZANAFLEX) tablet 4 mg (4 mg Oral Given 10/24/21 2206)  HYDROmorphone (DILAUDID) injection 1 mg (1 mg Intravenous Given 10/24/21 2206)     IMPRESSION / MDM / ASSESSMENT AND PLAN / ED COURSE  I reviewed the triage vital signs  and the nursing notes.                               The patient is on the cardiac monitor to evaluate for evidence of arrhythmia and/or significant heart rate changes.   Ddx:  Differential includes the following, with pertinent life- or limb-threatening emergencies considered:  Recurrent lumbar radiculopathy, other neuropathic/neuropraxia pain, postoperative hematoma versus abscess versus osteomyelitis less likely.  Patient's presentation is most consistent with acute illness / injury with system symptoms.  MDM:  41 year old female with history of lumbar radiculopathy here with ongoing, but worsening, right leg and lower back pain.  Patient underwent right L4-5 laminal foraminotomy, microdiscectomy of L5-S1 with Dr. Marcell Barlow in May.  On exam, she has intact strength and sensation although somewhat limited due to pain.  She is having what sounds like spasm of her paraspinal muscles.  No loss of bowel or bladder function.  No fevers, chills, or signs to suggest infectious process.  Given the degree of her pain, will obtain MRI for further assessment. IV analgesia given.   Plan to f/u MRI. Suspect she can hopefully f/u as outpt if no emergent pathology identified. Suspect ongoing radiculopathy, may ultimately benefit from injections/further tx.   MEDICATIONS GIVEN IN ED: Medications  HYDROmorphone (DILAUDID) injection 1 mg (1 mg Intravenous Given 10/24/21 1730)  ondansetron (ZOFRAN) injection 4 mg (4 mg Intravenous Given 10/24/21 1726)  ketorolac (TORADOL) 30 MG/ML injection 15 mg (15 mg Intravenous Given 10/24/21 1730)  HYDROmorphone (DILAUDID) injection 1 mg (1 mg Intravenous Given 10/24/21 1922)  gadobutrol (GADAVIST) 1 MMOL/ML injection 9 mL (10 mLs Intravenous Contrast Given 10/24/21 1938)  methylPREDNISolone sodium succinate (SOLU-MEDROL) 125 mg/2 mL injection 125 mg (125 mg Intravenous Given 10/24/21 2114)  gabapentin (NEURONTIN) capsule 600 mg (600 mg Oral Given 10/24/21 2206)  tiZANidine  (ZANAFLEX) tablet 4 mg (4 mg Oral Given 10/24/21 2206)  HYDROmorphone (DILAUDID) injection 1 mg (1 mg Intravenous Given 10/24/21 2206)     Consults:  None   EMR reviewed  Reviewed op notes from Dr. Marcell Barlow, clinic notes for visits with spasms/ongoing pain     FINAL CLINICAL IMPRESSION(S) / ED DIAGNOSES   Final diagnoses:  Chronic midline low back pain, unspecified whether sciatica present  Lumbar radiculopathy     Rx / DC Orders   ED Discharge Orders     None        Note:  This document was prepared using Dragon voice recognition software and may include unintentional dictation errors.   Shaune Pollack, MD 10/24/21 2232

## 2021-10-24 NOTE — Discharge Instructions (Signed)
Prescription medicines as discussed.  Work to increase her gabapentin to 600 mg 3 times a day over the next week.  Take the tizanidine muscle accident as needed.  Dose the steroid taper pack as directed.  Follow-up with Dr. Marcell Barlow for ongoing symptoms.

## 2021-10-27 ENCOUNTER — Telehealth: Payer: Self-pay

## 2021-10-27 ENCOUNTER — Ambulatory Visit: Payer: Medicaid Other

## 2021-10-27 NOTE — Telephone Encounter (Signed)
-----   Message from Rockey Situ sent at 10/27/2021  8:45 AM EDT ----- Regarding: postop pain Contact: [phone] RIGHT L4-5 LAMINOFORAMINOTOMY, RIGHT L5-S1 MICRODISCECTOMY  Patient says that her pain goes and comes. She said that something is wrong. She had to go to the ER on Sunday. The nerve in her leg is pulling really bad. She is not walking. What can you do for her? She is aware that D.Y is in surgery.

## 2021-10-29 ENCOUNTER — Ambulatory Visit: Payer: Medicaid Other | Admitting: Neurosurgery

## 2021-10-29 ENCOUNTER — Other Ambulatory Visit: Payer: Self-pay

## 2021-10-29 ENCOUNTER — Encounter: Payer: Self-pay | Admitting: Neurosurgery

## 2021-10-29 VITALS — BP 154/75 | HR 98 | Ht 67.0 in | Wt 198.0 lb

## 2021-10-29 DIAGNOSIS — M5416 Radiculopathy, lumbar region: Secondary | ICD-10-CM

## 2021-10-29 DIAGNOSIS — R29898 Other symptoms and signs involving the musculoskeletal system: Secondary | ICD-10-CM

## 2021-10-29 DIAGNOSIS — Z01818 Encounter for other preprocedural examination: Secondary | ICD-10-CM

## 2021-10-29 MED ORDER — GABAPENTIN 600 MG PO TABS: 900.0000 mg | ORAL_TABLET | Freq: Three times a day (TID) | ORAL | 0 refills | Status: DC

## 2021-10-29 MED ORDER — DIAZEPAM 5 MG PO TABS: 5.0000 mg | ORAL_TABLET | Freq: Three times a day (TID) | ORAL | 0 refills | Status: AC | PRN

## 2021-10-29 MED ORDER — OXYCODONE HCL 5 MG PO TABS: 5.0000 mg | ORAL_TABLET | Freq: Four times a day (QID) | ORAL | 0 refills | Status: DC | PRN

## 2021-10-29 NOTE — H&P (View-Only) (Signed)
  Referring Physician:  [facility] [address] [place],  [place] [address]  Primary Physician:  [facility]  DOS: 08/30/21 right L4-5 foraminotomy and right L5-S1 microdiscectomy   History of Present Illness: 10/29/2021 [name] is here today with a chief complaint of worsening pain and weakness.  She did well for the first 3 weeks after surgery, but has since had progressive pain down her right leg similar to preop.  She is developed weakness in her right leg.  She is having trouble walking and sleeping.  She is unable to perform all of her activities of daily living.   Bowel/Bladder Dysfunction: none   The symptoms are causing a significant impact on the patient's life.   Review of Systems:  A 10 point review of systems is negative, except for the pertinent positives and negatives detailed in the HPI.  Past Medical History: Past Medical History:  Diagnosis Date   ADHD (attention deficit hyperactivity disorder)    Anemia    h/o   Anxiety    Asthma    Chest pain, unspecified    Genital herpes    History of chlamydia    History of gonorrhea    History of marijuana use    Obesity    SOB (shortness of breath)    Spinal stenosis of lumbar region with radiculopathy     Past Surgical History: Past Surgical History:  Procedure Laterality Date   CESAREAN SECTION     x1   FOOT SURGERY Right    broken toe   LUMBAR LAMINECTOMY/DECOMPRESSION MICRODISCECTOMY Right 08/30/2021   Procedure: RIGHT L4-5 LAMINOFORAMINOTOMY, RIGHT L5-S1 MICRODISCECTOMY;  Surgeon: Geneveive Furness, MD;  Location: [facility] ORS;  Service: Neurosurgery;  Laterality: Right;    Allergies: Allergies as of 10/29/2021 - Review Complete 10/29/2021  Allergen Reaction Noted   Codeine Swelling 04/02/2015   Penicillins Swelling and Other (See Comments) 04/02/2015   Benadryl [diphenhydramine] Hives 09/04/2015   Bupropion Palpitations  02/06/2019    Medications: No outpatient medications have been marked as taking for the 10/29/21 encounter (Office Visit) with Mayeli Bornhorst, MD.   Current Facility-Administered Medications for the 10/29/21 encounter (Office Visit) with Euel Castile, MD  Medication   botulinum toxin Type A (BOTOX) injection 100 Units    Social History: Social History   Tobacco Use   Smoking status: Former    Packs/day: 0.50    Years: 20.00    Total pack years: 10.00    Types: Cigarettes    Quit date: 2022    Years since quitting: 1.5   Smokeless tobacco: Never  Vaping Use   Vaping Use: Every day  Substance Use Topics   Alcohol use: No   Drug use: Not Currently    Types: Marijuana    Family Medical History: History reviewed. No pertinent family history.  Physical Examination: Vitals:   10/29/21 1036  BP: (!) 154/75  Pulse: 98    General: Patient is well developed,  and in moderate distress. Attention to examination is appropriate.  Psychiatric: Patient is non-anxious.  Head:  Pupils equal, round, and reactive to light.  ENT:  Oral mucosa appears well hydrated.  Neck:   Supple. Respiratory: Patient is breathing without any difficulty.  Extremities: No edema.  Vascular: Palpable dorsal pedal pulses.  Skin:   On exposed skin, there are no abnormal skin lesions.  NEUROLOGICAL:     Awake, alert, oriented to person, place, and time.  Speech   is clear and fluent. Fund of knowledge is appropriate.   Cranial Nerves: Pupils equal round and reactive to light.  Facial tone is symmetric.  Facial sensation is symmetric. Shoulder shrug is symmetric. Tongue protrusion is midline.  There is no pronator drift.   Strength: Side Biceps Triceps Deltoid Interossei Grip Wrist Ext. Wrist Flex.  R 5 5 5 5 5 5 5   L 5 5 5 5 5 5 5    Side Iliopsoas Quads Hamstring PF DF EHL  R 5 5 5 5 3  4+  L 5 5 5 5 5 5    Reflexes are 2+ and symmetric at the biceps, triceps, brachioradialis,  patella.  I cannot elicit her right Achilles reflex.   Hoffman's is absent.  Clonus is not present.  Toes are down-going.  Bilateral upper and lower extremity sensation is intact to light touch with exception of right S1 distribution which is diminished.  She is unable to stand up straight.  She is unable to walk without severe pain.     Medical Decision Making  Imaging: MRI L spine 10/24/2021 L5-S1: Right laminectomy present. Residual disc material present centrally with continued displacement of the nerve roots. Moderate foraminal stenosis remains bilaterally.   IMPRESSION: 1. Right laminectomy at L4-5 and L5-S1 with continued displacement of the nerve roots at L5-S1 but no definite compression. 2. Moderate foraminal stenosis bilaterally at L5-S1 is stable. 3. Mild disc bulging and facet hypertrophy at L1-2 and L2-3 without significant stenosis. 4. No acute or focal lesion to explain the patient's right lower extremity symptoms.     Electronically Signed   By: M.D.   On: 10/24/2021 20:05  I have personally reviewed the images and agree with the above interpretation with the exception that it seems the right S1 nerve root is posteriorly displaced by a recurrent disc herniation.  Assessment and Plan: [name] is a pleasant 41 y.o. 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  She has severe symptoms down her right leg and has concordant weakness in her right lower extremity.  Given these findings, I do not think that conservative management is reasonable.  I recommended recurrent microdiscectomy.  I discussed the planned procedure at length with the patient, including the risks, benefits, alternatives, and indications. The risks discussed include but are not limited to bleeding, infection, need for reoperation, spinal fluid leak, stroke, vision loss, anesthetic complication, coma, paralysis, and even death. I  also described in detail that improvement was not guaranteed.  The patient expressed understanding of these risks, and asked that we proceed with surgery. I described the surgery in layman's terms, and gave ample opportunity for questions, which were answered to the best of my ability.    I spent a total of 30 minutes in face-to-face and non-face-to-face activities related to this patient's care today.  Thank you for involving me in the care of this patient.   This note was partially dictated using voice recognition software, so please excuse any errors that were not corrected.   Karilynn Carranza K. Marin Roberts MD, [facility] Neurosurgery

## 2021-10-29 NOTE — Patient Instructions (Addendum)
Below is a summary of what we discussed in regards to your upcoming surgery:  Planned surgery: Right redo L5-S1 microdiscectomy   Surgery date: 11/10/21 - you will find out your arrival time the business day before your surgery.   Pre-op appointment at [facility] Pre-admit Testing: we will call you with a date/time for this. Pre-admit testing is located on the first floor of the [facility] building, [address].   Pre-op labs will be done at your pre-op appointment. You are not required to fast for these labs.    Covid testing:  a pre-op Covid test is not required unless you are symptomatic or exposed.    Should you need to change your pre-op appointment, please call Pre-admit testing at [phone].    If you have FMLA/disability paperwork, please drop it off or fax it to [phone], attention [place].   If you have any questions/concerns before or after surgery, you can reach [place] at [phone], or you can send a mychart message. If you have a concern after hours that cannot wait until normal business hours, you can call [phone] or [phone] and ask the answering service to page the neurosurgeon on call.   Appointments/FMLA & disability paperwork: [place] Nurse: Royston Cowper  Medical assistant: Irving Burton Surgeon: Dr Venetia Night Physician Assistant: Manning Charity

## 2021-10-29 NOTE — Progress Notes (Signed)
Referring Physician:  [facility] [address] [place],  [place] [address]  Primary Physician:  [facility]  DOS: 08/30/21 right L4-5 foraminotomy and right L5-S1 microdiscectomy   History of Present Illness: 10/29/2021 Melissa Black is here today with a chief complaint of worsening pain and weakness.  She did well for the first 3 weeks after surgery, but has since had progressive pain down her right leg similar to preop.  She is developed weakness in her right leg.  She is having trouble walking and sleeping.  She is unable to perform all of her activities of daily living.   Bowel/Bladder Dysfunction: none   The symptoms are causing a significant impact on the patient's life.   Review of Systems:  A 10 point review of systems is negative, except for the pertinent positives and negatives detailed in the HPI.  Past Medical History: Past Medical History:  Diagnosis Date   ADHD (attention deficit hyperactivity disorder)    Anemia    h/o   Anxiety    Asthma    Chest pain, unspecified    Genital herpes    History of chlamydia    History of gonorrhea    History of marijuana use    Obesity    SOB (shortness of breath)    Spinal stenosis of lumbar region with radiculopathy     Past Surgical History: Past Surgical History:  Procedure Laterality Date   CESAREAN SECTION     x1   FOOT SURGERY Right    broken toe   LUMBAR LAMINECTOMY/DECOMPRESSION MICRODISCECTOMY Right 08/30/2021   Procedure: RIGHT L4-5 LAMINOFORAMINOTOMY, RIGHT L5-S1 MICRODISCECTOMY;  Surgeon: Venetia Night, MD;  Location: [facility] ORS;  Service: Neurosurgery;  Laterality: Right;    Allergies: Allergies as of 10/29/2021 - Review Complete 10/29/2021  Allergen Reaction Noted   Codeine Swelling 04/02/2015   Penicillins Swelling and Other (See Comments) 04/02/2015   Benadryl [diphenhydramine] Hives 09/04/2015   Bupropion Palpitations  02/06/2019    Medications: No outpatient medications have been marked as taking for the 10/29/21 encounter (Office Visit) with Venetia Night, MD.   Current Facility-Administered Medications for the 10/29/21 encounter (Office Visit) with Venetia Night, MD  Medication   botulinum toxin Type A (BOTOX) injection 100 Units    Social History: Social History   Tobacco Use   Smoking status: Former    Packs/day: 0.50    Years: 20.00    Total pack years: 10.00    Types: Cigarettes    Quit date: 2022    Years since quitting: 1.5   Smokeless tobacco: Never  Vaping Use   Vaping Use: Every day  Substance Use Topics   Alcohol use: No   Drug use: Not Currently    Types: Marijuana    Family Medical History: History reviewed. No pertinent family history.  Physical Examination: Vitals:   10/29/21 1036  BP: (!) 154/75  Pulse: 98    General: Patient is well developed,  and in moderate distress. Attention to examination is appropriate.  Psychiatric: Patient is non-anxious.  Head:  Pupils equal, round, and reactive to light.  ENT:  Oral mucosa appears well hydrated.  Neck:   Supple. Respiratory: Patient is breathing without any difficulty.  Extremities: No edema.  Vascular: Palpable dorsal pedal pulses.  Skin:   On exposed skin, there are no abnormal skin lesions.  NEUROLOGICAL:     Awake, alert, oriented to person, place, and time.  Speech  is clear and fluent. Fund of knowledge is appropriate.   Cranial Nerves: Pupils equal round and reactive to light.  Facial tone is symmetric.  Facial sensation is symmetric. Shoulder shrug is symmetric. Tongue protrusion is midline.  There is no pronator drift.   Strength: Side Biceps Triceps Deltoid Interossei Grip Wrist Ext. Wrist Flex.  R 5 5 5 5 5 5 5   L 5 5 5 5 5 5 5    Side Iliopsoas Quads Hamstring PF DF EHL  R 5 5 5 5 3  4+  L 5 5 5 5 5 5    Reflexes are 2+ and symmetric at the biceps, triceps, brachioradialis,  patella.  I cannot elicit her right Achilles reflex.   Hoffman's is absent.  Clonus is not present.  Toes are down-going.  Bilateral upper and lower extremity sensation is intact to light touch with exception of right S1 distribution which is diminished.  She is unable to stand up straight.  She is unable to walk without severe pain.     Medical Decision Making  Imaging: MRI L spine 10/24/2021 L5-S1: Right laminectomy present. Residual disc material present centrally with continued displacement of the nerve roots. Moderate foraminal stenosis remains bilaterally.   IMPRESSION: 1. Right laminectomy at L4-5 and L5-S1 with continued displacement of the nerve roots at L5-S1 but no definite compression. 2. Moderate foraminal stenosis bilaterally at L5-S1 is stable. 3. Mild disc bulging and facet hypertrophy at L1-2 and L2-3 without significant stenosis. 4. No acute or focal lesion to explain the patient's right lower extremity symptoms.     Electronically Signed   By: M.D.   On: 10/24/2021 20:05  I have personally reviewed the images and agree with the above interpretation with the exception that it seems the right S1 nerve root is posteriorly displaced by a recurrent disc herniation.  Assessment and Plan: Melissa Black is a pleasant 41 y.o. 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  She has severe symptoms down her right leg and has concordant weakness in her right lower extremity.  Given these findings, I do not think that conservative management is reasonable.  I recommended recurrent microdiscectomy.  I discussed the planned procedure at length with the patient, including the risks, benefits, alternatives, and indications. The risks discussed include but are not limited to bleeding, infection, need for reoperation, spinal fluid leak, stroke, vision loss, anesthetic complication, coma, paralysis, and even death. I  also described in detail that improvement was not guaranteed.  The patient expressed understanding of these risks, and asked that we proceed with surgery. I described the surgery in layman's terms, and gave ample opportunity for questions, which were answered to the best of my ability.    I spent a total of 30 minutes in face-to-face and non-face-to-face activities related to this patient's care today.  Thank you for involving me in the care of this patient.   This note was partially dictated using voice recognition software, so please excuse any errors that were not corrected.   Mariluz Crespo K. Marin Roberts MD, [facility] Neurosurgery

## 2021-11-01 ENCOUNTER — Telehealth: Payer: Self-pay

## 2021-11-01 ENCOUNTER — Other Ambulatory Visit: Payer: Self-pay

## 2021-11-01 DIAGNOSIS — Z01818 Encounter for other preprocedural examination: Secondary | ICD-10-CM

## 2021-11-01 MED ORDER — CEFAZOLIN IN SODIUM CHLORIDE 2-0.9 GM/100ML-% IV SOLN: 2.0000 g | Freq: Once | INTRAVENOUS | Status: DC

## 2021-11-01 NOTE — Telephone Encounter (Signed)
-----   Message from Rockey Situ sent at 11/01/2021  9:24 AM EDT ----- Regarding: prior auth Contact: [phone] Oxycodone, her pharmacy is telling her that it requires a prior auth from her insurance. [facility] faxed over form. Can you please get this taken care of she needs medication.

## 2021-11-01 NOTE — Telephone Encounter (Signed)
Request has been sent to plan (through covermymeds) and is pending review.  [facility]

## 2021-11-02 ENCOUNTER — Other Ambulatory Visit: Payer: Self-pay

## 2021-11-02 NOTE — Telephone Encounter (Signed)
Approved 11/01/21-05/04/22 Notified pt and [facility]

## 2021-11-08 ENCOUNTER — Encounter
Admission: RE | Admit: 2021-11-08 | Discharge: 2021-11-08 | Disposition: A | Discharge status: Home / Self Care | Payer: Medicaid Other | Source: Ambulatory Visit | Attending: Neurosurgery | Admitting: Neurosurgery

## 2021-11-08 ENCOUNTER — Other Ambulatory Visit: Payer: Self-pay

## 2021-11-08 VITALS — BP 118/90 | HR 87 | Resp 18 | Ht 67.0 in | Wt 203.0 lb

## 2021-11-08 DIAGNOSIS — Z01812 Encounter for preprocedural laboratory examination: Secondary | ICD-10-CM

## 2021-11-08 DIAGNOSIS — R8290 Unspecified abnormal findings in urine: Secondary | ICD-10-CM | POA: Diagnosis not present

## 2021-11-08 LAB — URINALYSIS, ROUTINE W REFLEX MICROSCOPIC
Bilirubin Urine: NEGATIVE
Glucose, UA: NEGATIVE mg/dL
Hgb urine dipstick: NEGATIVE
Ketones, ur: NEGATIVE mg/dL
Nitrite: NEGATIVE
Protein, ur: 30 mg/dL — AB
Specific Gravity, Urine: 1.031 — ABNORMAL HIGH (ref 1.005–1.030)
pH: 5 (ref 5.0–8.0)

## 2021-11-08 LAB — SURGICAL PCR SCREEN
MRSA, PCR: NEGATIVE
Staphylococcus aureus: NEGATIVE

## 2021-11-08 LAB — CBC
HCT: 45.3 % (ref 36.0–46.0)
Hemoglobin: 15 g/dL (ref 12.0–15.0)
MCH: 30.1 pg (ref 26.0–34.0)
MCHC: 33.1 g/dL (ref 30.0–36.0)
MCV: 91 fL (ref 80.0–100.0)
Platelets: 298 10*3/uL (ref 150–400)
RBC: 4.98 MIL/uL (ref 3.87–5.11)
RDW: 13.8 % (ref 11.5–15.5)
WBC: 10.7 10*3/uL — ABNORMAL HIGH (ref 4.0–10.5)
nRBC: 0 % (ref 0.0–0.2)

## 2021-11-08 LAB — BASIC METABOLIC PANEL
Anion gap: 6 (ref 5–15)
BUN: 15 mg/dL (ref 6–20)
CO2: 23 mmol/L (ref 22–32)
Calcium: 8.4 mg/dL — ABNORMAL LOW (ref 8.9–10.3)
Chloride: 110 mmol/L (ref 98–111)
Creatinine, Ser: 0.86 mg/dL (ref 0.44–1.00)
GFR, Estimated: >60 mL/min (ref >60)
Glucose, Bld: 136 mg/dL — ABNORMAL HIGH (ref 70–99)
Potassium: 3.9 mmol/L (ref 3.5–5.1)
Sodium: 139 mmol/L (ref 135–145)

## 2021-11-08 LAB — TYPE AND SCREEN
ABO/RH(D): A POS
Antibody Screen: NEGATIVE

## 2021-11-08 MED ORDER — FAMOTIDINE 20 MG PO TABS: 20.0000 mg | ORAL_TABLET | Freq: Once | ORAL | Status: DC

## 2021-11-08 NOTE — Patient Instructions (Addendum)
Your procedure is scheduled on: 11/10/21 - Wednesday Report to the Registration Desk on the 1st floor of the [facility]. To find out your arrival time, please call [phone] between 1PM - 3PM on: 11/09/21 - Tuesday If your arrival time is 6:00 am, do not arrive prior to that time as the [facility] entrance doors do not open until 6:00 am.  REMEMBER: Instructions that are not followed completely may result in serious medical risk, up to and including death; or upon the discretion of your surgeon and anesthesiologist your surgery may need to be rescheduled.  Do not eat food after midnight the night before surgery.  No gum chewing, lozengers or hard candies.  You may however, drink CLEAR liquids up to 2 hours before you are scheduled to arrive for your surgery. Do not drink anything within 2 hours of your scheduled arrival time.  Clear liquids include: - water  - apple juice without pulp - gatorade (not RED colors) - black coffee or tea (Do NOT add milk or creamers to the coffee or tea) Do NOT drink anything that is not on this list.  TAKE THESE MEDICATIONS THE MORNING OF SURGERY WITH A SIP OF WATER:  - DULoxetine (CYMBALTA) 60 MG capsule - gabapentin (NEURONTIN) 600 MG tablet take 1, and 1/2 tabs  Use inhalers on the day of surgery and bring to the hospital. albuterol (PROVENTIL HFA;VENTOLIN HFA) 108 (90 Base) MCG/ACT inhaler  One week prior to surgery: Stop Anti-inflammatories (NSAIDS) such as Advil, Aleve, Ibuprofen, Motrin, Naproxen, Naprosyn and Aspirin based products such as Excedrin, Goodys Powder, BC Powder.  Stop ANY OVER THE COUNTER supplements until after surgery.Multiple Vitamin   You may however, continue to take Tylenol if needed for pain up until the day of surgery.  No Alcohol for 24 hours before or after surgery.  No Smoking including e-cigarettes for 24 hours prior to surgery.  No chewable tobacco products for at least 6 hours prior to surgery.  No  nicotine patches on the day of surgery.  Do not use any "recreational" drugs for at least a week prior to your surgery.  Please be advised that the combination of cocaine and anesthesia may have negative outcomes, up to and including death. If you test positive for cocaine, your surgery will be cancelled.  On the morning of surgery brush your teeth with toothpaste and water, you may rinse your mouth with mouthwash if you wish. Do not swallow any toothpaste or mouthwash.  Use CHG Soap or wipes as directed on instruction sheet.  Do not wear jewelry, make-up, hairpins, clips or nail polish.  Do not wear lotions, powders, or perfumes.   Do not shave body from the neck down 48 hours prior to surgery just in case you cut yourself which could leave a site for infection.  Also, freshly shaved skin may become irritated if using the CHG soap.  Contact lenses, hearing aids and dentures may not be worn into surgery.  Do not bring valuables to the hospital. [facility] is not responsible for any missing/lost belongings or valuables.   Notify your doctor if there is any change in your medical condition (cold, fever, infection).  Wear comfortable clothing (specific to your surgery type) to the hospital.  After surgery, you can help prevent lung complications by doing breathing exercises.  Take deep breaths and cough every 1-2 hours. Your doctor may order a device called an Incentive Spirometer to help you take deep breaths. When coughing or sneezing,  hold a pillow firmly against your incision with both hands. This is called "splinting." Doing this helps protect your incision. It also decreases belly discomfort.  If you are being admitted to the hospital overnight, leave your suitcase in the car. After surgery it may be brought to your room.  If you are being discharged the day of surgery, you will not be allowed to drive home. You will need a responsible adult (18 years or older) to drive you home  and stay with you that night.   If you are taking public transportation, you will need to have a responsible adult (18 years or older) with you. Please confirm with your physician that it is acceptable to use public transportation.   Please call the Pre-admissions Testing Dept. at [phone] if you have any questions about these instructions.  Surgery Visitation Policy:  Patients undergoing a surgery or procedure may have two family members or support persons with them as long as the person is not COVID-19 positive or experiencing its symptoms.   Inpatient Visitation:    Visiting hours are 7 a.m. to 8 p.m. Up to four visitors are allowed at one time in a patient room, including children. The visitors may rotate out with other people during the day. One designated support person (adult) may remain overnight.

## 2021-11-09 ENCOUNTER — Other Ambulatory Visit: Payer: Medicaid Other

## 2021-11-09 ENCOUNTER — Telehealth: Payer: Self-pay | Admitting: Urgent Care

## 2021-11-09 DIAGNOSIS — N390 Urinary tract infection, site not specified: Secondary | ICD-10-CM

## 2021-11-09 DIAGNOSIS — Z01812 Encounter for preprocedural laboratory examination: Secondary | ICD-10-CM

## 2021-11-09 MED ORDER — CIPROFLOXACIN HCL 500 MG PO TABS: 500.0000 mg | ORAL_TABLET | Freq: Two times a day (BID) | ORAL | 0 refills | Status: AC

## 2021-11-09 NOTE — Progress Notes (Signed)
[facility] Perioperative Services: Pre-Admission/Anesthesia Testing  Abnormal Lab Notification and Treatment Plan of Care   Date: 11/09/21  Name: Melissa Black MRN:   [id number]  Re: Abnormal labs noted during PAT appointment   Notified:  Provider Name Provider Role Notification Mode  Venetia Night, MD Neurosurgery Routed and/or faxed via [facility]   Abnormal Lab Value(s):   Lab Results  Component Value Date   COLORURINE AMBER (A) 11/08/2021   APPEARANCEUR TURBID (A) 11/08/2021   LABSPEC 1.031 (H) 11/08/2021   PHURINE 5.0 11/08/2021   GLUCOSEU NEGATIVE 11/08/2021   HGBUR NEGATIVE 11/08/2021   BILIRUBINUR NEGATIVE 11/08/2021   KETONESUR NEGATIVE 11/08/2021   PROTEINUR 30 (A) 11/08/2021   NITRITE NEGATIVE 11/08/2021   LEUKOCYTESUR LARGE (A) 11/08/2021   EPIU 21-50 11/08/2021   WBCU 21-50 11/08/2021   RBCU 11-20 11/08/2021   BACTERIA MANY (A) 11/08/2021   CULT  11/08/2021    CULTURE REINCUBATED FOR BETTER GROWTH Performed at [facility], [address] [place], [place] [address]     Clinical Information and Notes:  Patient is scheduled for OPEN RIGHT REDO L5-S1 MICRODISCECTOMY (Right) on 11/08/2021.    UA performed in PAT consistent with/concerning for infection.  No leukocytosis noted on CBC; WBC 10.7 K/uL Renal function: Estimated Creatinine Clearance (by C-G formula based on SCr of 0.86 mg/dL) = 532.9 mL/min Urine C&S added to assess for pathogenically significant growth.  Impression and Plan:  Melissa Black with a UA that was (+) for infection; reflex culture sent. Patient's surgery is tomorrow. Contacted lab to discuss. Per lab technician, sample with very young colony counts; mixed flora with a predominance of GNRs. Contacted patient to discuss. Patient reporting that she is not experiencing any significant symptoms at this time. DDx is ASB vs. true UTI. In efforts to avoid delaying patient's procedure, or have her  experience any potentially significant perioperative complications related to the aforementioned, I would like to proceed with empiric treatment for urinary tract infection. Spoke with surgeon to determine appropriate course of action given that patient's surgery is tomorrow, and he agreed with proposed plan. Will proceed as follows:    Allergies reviewed. Sensitivity report not available for review at this time, however per MD direction, we are proceeding with empiric coverage given the nature of her surgery and potential for associated complications related to spinal instrumentation.  Will treat with a 3 day course of ciprofloxacin. Patient encouraged to complete the entire course of antibiotics even if she begins to feel better. She was advised that if culture demonstrates resistance to the prescribed antibiotic, she will be contacted and advised of the need to change the antibiotic being used to treat her infection.   Meds ordered this encounter  Medications   ciprofloxacin (CIPRO) 500 MG tablet    Sig: Take 1 tablet (500 mg total) by mouth 2 (two) times daily for 3 days. Increase WATER intake while taking this medication.    Dispense:  6 tablet    Refill:  0   Patient encouraged to increase her fluid intake as much as possible. Discussed that water is always best to flush the urinary tract. She was advised to avoid caffeine containing fluids until her infections clears, as caffeine can cause her to experience painful bladder spasms.   May use Tylenol as needed for pain/fever should she experience these symptoms.   Patient is scheduled to receive intravenous CEFAZOLIN prior to her procedure. MD asking that orders also be  placed for preoperative GENTAMYCIN as well; order entered.   Patient instructed to call surgeon's office or PAT with any questions or concerns related to the above outlined course of treatment. Additionally, she was instructed to call if she feels like she is getting worse  overall while on treatment. Results and treatment plan of care forwarded to primary attending surgeon to make them aware.   Encounter Diagnoses  Name Primary?   Pre-operative laboratory examination Yes   UTI (urinary tract infection)    Quentin Mulling, MSN, APRN, FNP-C, CEN [facility]  Peri-operative Services Nurse Practitioner Phone: [phone] Fax: [phone] 11/09/21 3:57 PM   NOTE: This note has been prepared using Dragon dictation software. Despite my best ability to proofread, there is always the potential that unintentional transcriptional errors may still occur from this process.

## 2021-11-10 ENCOUNTER — Ambulatory Visit: Payer: Medicaid Other | Admitting: Urgent Care

## 2021-11-10 ENCOUNTER — Other Ambulatory Visit: Payer: Self-pay

## 2021-11-10 ENCOUNTER — Ambulatory Visit: Payer: Medicaid Other

## 2021-11-10 ENCOUNTER — Observation Stay
Admission: RE | Admit: 2021-11-10 | Discharge: 2021-11-12 | Disposition: A | Discharge status: Home / Self Care | Payer: Medicaid Other | Attending: Neurosurgery | Admitting: Neurosurgery

## 2021-11-10 ENCOUNTER — Encounter
Admission: RE | Disposition: A | Discharge status: Home / Self Care | Payer: Self-pay | Source: Home / Self Care | Attending: Neurosurgery

## 2021-11-10 ENCOUNTER — Encounter: Payer: Self-pay | Admitting: Neurosurgery

## 2021-11-10 DIAGNOSIS — R29898 Other symptoms and signs involving the musculoskeletal system: Secondary | ICD-10-CM | POA: Diagnosis not present

## 2021-11-10 DIAGNOSIS — Z87891 Personal history of nicotine dependence: Secondary | ICD-10-CM | POA: Diagnosis not present

## 2021-11-10 DIAGNOSIS — Z01812 Encounter for preprocedural laboratory examination: Secondary | ICD-10-CM

## 2021-11-10 DIAGNOSIS — M5416 Radiculopathy, lumbar region: Secondary | ICD-10-CM | POA: Diagnosis not present

## 2021-11-10 DIAGNOSIS — J45909 Unspecified asthma, uncomplicated: Secondary | ICD-10-CM | POA: Diagnosis not present

## 2021-11-10 DIAGNOSIS — M5117 Intervertebral disc disorders with radiculopathy, lumbosacral region: Principal | ICD-10-CM | POA: Insufficient documentation

## 2021-11-10 HISTORY — PX: HEMI-MICRODISCECTOMY LUMBAR LAMINECTOMY LEVEL 1: SHX5846

## 2021-11-10 LAB — POCT PREGNANCY, URINE: Preg Test, Ur: NEGATIVE

## 2021-11-10 LAB — URINE CULTURE

## 2021-11-10 SURGERY — HEMI-MICRODISCECTOMY LUMBAR LAMINECTOMY LEVEL 1
Anesthesia: General | Site: Spine Lumbar | Laterality: Right

## 2021-11-10 MED ORDER — METHYLPREDNISOLONE ACETATE 40 MG/ML IJ SUSP
INTRAMUSCULAR | Status: AC
  Filled 2021-11-10: qty 1

## 2021-11-10 MED ORDER — OXYCODONE HCL 5 MG PO TABS
5.0000 mg | ORAL_TABLET | ORAL | Status: DC | PRN
  Administered 2021-11-10 – 2021-11-12 (×9): 5 mg via ORAL
  Filled 2021-11-10 (×9): qty 1

## 2021-11-10 MED ORDER — FENTANYL CITRATE (PF) 100 MCG/2ML IJ SOLN
25.0000 ug | INTRAMUSCULAR | Status: DC | PRN
  Administered 2021-11-10 (×2): 50 ug via INTRAVENOUS

## 2021-11-10 MED ORDER — FIBRIN SEALANT 2 ML SINGLE DOSE KIT
PACK | CUTANEOUS | Status: DC | PRN
  Administered 2021-11-10: 2 mL via TOPICAL

## 2021-11-10 MED ORDER — SODIUM CHLORIDE 0.9 % IV SOLN: 250.0000 mL | INTRAVENOUS | Status: DC

## 2021-11-10 MED ORDER — BUPIVACAINE HCL (PF) 0.5 % IJ SOLN
INTRAMUSCULAR | Status: AC
  Filled 2021-11-10: qty 30

## 2021-11-10 MED ORDER — SODIUM CHLORIDE FLUSH 0.9 % IV SOLN
INTRAVENOUS | Status: AC
  Filled 2021-11-10: qty 20

## 2021-11-10 MED ORDER — ACETAMINOPHEN 500 MG PO TABS
1000.0000 mg | ORAL_TABLET | Freq: Four times a day (QID) | ORAL | Status: DC
  Administered 2021-11-10 – 2021-11-11 (×3): 1000 mg via ORAL
  Filled 2021-11-10 (×3): qty 2

## 2021-11-10 MED ORDER — KETOROLAC TROMETHAMINE 15 MG/ML IJ SOLN
INTRAMUSCULAR | Status: AC
  Filled 2021-11-10: qty 1

## 2021-11-10 MED ORDER — GLYCOPYRROLATE 0.2 MG/ML IJ SOLN
INTRAMUSCULAR | Status: DC | PRN
  Administered 2021-11-10: .2 mg via INTRAVENOUS

## 2021-11-10 MED ORDER — CEFAZOLIN SODIUM-DEXTROSE 2-4 GM/100ML-% IV SOLN
2.0000 g | Freq: Once | INTRAVENOUS | Status: AC
  Administered 2021-11-10: 2 g via INTRAVENOUS

## 2021-11-10 MED ORDER — DOCUSATE SODIUM 100 MG PO CAPS
100.0000 mg | ORAL_CAPSULE | Freq: Two times a day (BID) | ORAL | Status: DC
  Administered 2021-11-10 – 2021-11-11 (×4): 100 mg via ORAL
  Filled 2021-11-10 (×4): qty 1

## 2021-11-10 MED ORDER — BUPIVACAINE LIPOSOME 1.3 % IJ SUSP
INTRAMUSCULAR | Status: AC
  Filled 2021-11-10: qty 20

## 2021-11-10 MED ORDER — ONDANSETRON HCL 4 MG/2ML IJ SOLN
4.0000 mg | Freq: Four times a day (QID) | INTRAMUSCULAR | Status: DC | PRN
Start: 2021-11-10 — End: 2021-11-12

## 2021-11-10 MED ORDER — OXYCODONE HCL 5 MG PO TABS: 5.0000 mg | ORAL_TABLET | Freq: Once | ORAL | Status: DC | PRN

## 2021-11-10 MED ORDER — ALBUTEROL SULFATE (2.5 MG/3ML) 0.083% IN NEBU: 2.5000 mg | INHALATION_SOLUTION | Freq: Four times a day (QID) | RESPIRATORY_TRACT | Status: DC | PRN

## 2021-11-10 MED ORDER — OXYCODONE HCL 5 MG/5ML PO SOLN: 5.0000 mg | Freq: Once | ORAL | Status: DC | PRN

## 2021-11-10 MED ORDER — CIPROFLOXACIN HCL 500 MG PO TABS
500.0000 mg | ORAL_TABLET | Freq: Two times a day (BID) | ORAL | Status: AC
  Administered 2021-11-10 – 2021-11-11 (×3): 500 mg via ORAL
  Filled 2021-11-10 (×3): qty 1

## 2021-11-10 MED ORDER — DULOXETINE HCL 30 MG PO CPEP
60.0000 mg | ORAL_CAPSULE | Freq: Every morning | ORAL | Status: DC
  Administered 2021-11-11 – 2021-11-12 (×2): 60 mg via ORAL
  Filled 2021-11-10 (×2): qty 2

## 2021-11-10 MED ORDER — DEXMEDETOMIDINE (PRECEDEX) IN NS 20 MCG/5ML (4 MCG/ML) IV SYRINGE
PREFILLED_SYRINGE | INTRAVENOUS | Status: DC | PRN
  Administered 2021-11-10: 12 ug via INTRAVENOUS
  Administered 2021-11-10: 8 ug via INTRAVENOUS

## 2021-11-10 MED ORDER — DEXAMETHASONE SODIUM PHOSPHATE 10 MG/ML IJ SOLN
INTRAMUSCULAR | Status: DC | PRN
  Administered 2021-11-10: 10 mg via INTRAVENOUS

## 2021-11-10 MED ORDER — ACETAMINOPHEN 10 MG/ML IV SOLN
INTRAVENOUS | Status: DC | PRN
  Administered 2021-11-10: 1000 mg via INTRAVENOUS

## 2021-11-10 MED ORDER — FENTANYL CITRATE PF 50 MCG/ML IJ SOSY: 50.0000 ug | PREFILLED_SYRINGE | Freq: Once | INTRAMUSCULAR | Status: AC

## 2021-11-10 MED ORDER — PROPOFOL 500 MG/50ML IV EMUL
INTRAVENOUS | Status: DC | PRN
  Administered 2021-11-10: 50 ug/kg/min via INTRAVENOUS

## 2021-11-10 MED ORDER — FENTANYL CITRATE (PF) 100 MCG/2ML IJ SOLN
INTRAMUSCULAR | Status: AC
  Filled 2021-11-10: qty 2

## 2021-11-10 MED ORDER — SENNA 8.6 MG PO TABS
1.0000 | ORAL_TABLET | Freq: Two times a day (BID) | ORAL | Status: DC
  Administered 2021-11-10 – 2021-11-11 (×4): 8.6 mg via ORAL
  Filled 2021-11-10 (×4): qty 1

## 2021-11-10 MED ORDER — KETAMINE HCL 50 MG/5ML IJ SOSY
PREFILLED_SYRINGE | INTRAMUSCULAR | Status: AC
Start: 2021-11-10 — End: ?
  Filled 2021-11-10: qty 5

## 2021-11-10 MED ORDER — BUPIVACAINE-EPINEPHRINE (PF) 0.5% -1:200000 IJ SOLN
INTRAMUSCULAR | Status: AC
  Filled 2021-11-10: qty 30

## 2021-11-10 MED ORDER — SODIUM CHLORIDE 0.9 % IV SOLN: INTRAVENOUS | Status: DC

## 2021-11-10 MED ORDER — FENTANYL CITRATE (PF) 100 MCG/2ML IJ SOLN
INTRAMUSCULAR | Status: AC
  Administered 2021-11-10: 50 ug via INTRAVENOUS
  Filled 2021-11-10: qty 2

## 2021-11-10 MED ORDER — PROPOFOL 10 MG/ML IV BOLUS
INTRAVENOUS | Status: DC | PRN
  Administered 2021-11-10: 200 mg via INTRAVENOUS

## 2021-11-10 MED ORDER — CEFAZOLIN SODIUM-DEXTROSE 2-4 GM/100ML-% IV SOLN
INTRAVENOUS | Status: AC
  Filled 2021-11-10: qty 100

## 2021-11-10 MED ORDER — MIDAZOLAM HCL 2 MG/2ML IJ SOLN
INTRAMUSCULAR | Status: AC
  Filled 2021-11-10: qty 2

## 2021-11-10 MED ORDER — HYDROMORPHONE HCL 1 MG/ML IJ SOLN
INTRAMUSCULAR | Status: DC | PRN
  Administered 2021-11-10 (×2): .5 mg via INTRAVENOUS

## 2021-11-10 MED ORDER — MIDAZOLAM HCL 2 MG/2ML IJ SOLN
INTRAMUSCULAR | Status: DC | PRN
  Administered 2021-11-10: 2 mg via INTRAVENOUS

## 2021-11-10 MED ORDER — BUPIVACAINE-EPINEPHRINE (PF) 0.5% -1:200000 IJ SOLN
INTRAMUSCULAR | Status: DC | PRN
  Administered 2021-11-10: 10 mL

## 2021-11-10 MED ORDER — PROPOFOL 10 MG/ML IV BOLUS
INTRAVENOUS | Status: AC
  Filled 2021-11-10: qty 20

## 2021-11-10 MED ORDER — MENTHOL 3 MG MT LOZG: 1.0000 | LOZENGE | OROMUCOSAL | Status: DC | PRN

## 2021-11-10 MED ORDER — SUCCINYLCHOLINE CHLORIDE 200 MG/10ML IV SOSY
PREFILLED_SYRINGE | INTRAVENOUS | Status: AC
  Filled 2021-11-10: qty 10

## 2021-11-10 MED ORDER — LIDOCAINE HCL (CARDIAC) PF 100 MG/5ML IV SOSY
PREFILLED_SYRINGE | INTRAVENOUS | Status: DC | PRN
  Administered 2021-11-10: 100 mg via INTRAVENOUS

## 2021-11-10 MED ORDER — ORAL CARE MOUTH RINSE: 15.0000 mL | Freq: Once | OROMUCOSAL | Status: AC

## 2021-11-10 MED ORDER — METHOCARBAMOL 500 MG PO TABS
500.0000 mg | ORAL_TABLET | Freq: Four times a day (QID) | ORAL | Status: DC | PRN
  Administered 2021-11-10: 500 mg via ORAL
  Filled 2021-11-10: qty 1

## 2021-11-10 MED ORDER — KETOROLAC TROMETHAMINE 15 MG/ML IJ SOLN
7.5000 mg | Freq: Four times a day (QID) | INTRAMUSCULAR | Status: AC
  Administered 2021-11-10 – 2021-11-11 (×3): 7.5 mg via INTRAVENOUS
  Filled 2021-11-10 (×3): qty 1

## 2021-11-10 MED ORDER — SODIUM CHLORIDE 0.9% FLUSH: 3.0000 mL | INTRAVENOUS | Status: DC | PRN

## 2021-11-10 MED ORDER — ONDANSETRON HCL 4 MG/2ML IJ SOLN
INTRAMUSCULAR | Status: DC | PRN
  Administered 2021-11-10: 4 mg via INTRAVENOUS

## 2021-11-10 MED ORDER — CHLORHEXIDINE GLUCONATE 0.12 % MT SOLN
OROMUCOSAL | Status: AC
  Administered 2021-11-10: 15 mL via OROMUCOSAL
  Filled 2021-11-10: qty 15

## 2021-11-10 MED ORDER — SODIUM CHLORIDE 0.9% FLUSH
3.0000 mL | Freq: Two times a day (BID) | INTRAVENOUS | Status: DC
  Administered 2021-11-10 – 2021-11-12 (×3): 3 mL via INTRAVENOUS

## 2021-11-10 MED ORDER — 0.9 % SODIUM CHLORIDE (POUR BTL) OPTIME
TOPICAL | Status: DC | PRN
  Administered 2021-11-10: 500 mL

## 2021-11-10 MED ORDER — FENTANYL CITRATE PF 50 MCG/ML IJ SOSY
PREFILLED_SYRINGE | INTRAMUSCULAR | Status: AC
  Administered 2021-11-10: 50 ug via INTRAVENOUS
  Filled 2021-11-10: qty 1

## 2021-11-10 MED ORDER — GENTAMICIN SULFATE 40 MG/ML IJ SOLN
5.0000 mg/kg | Freq: Once | INTRAMUSCULAR | Status: AC
  Administered 2021-11-10: 460 mg via INTRAVENOUS
  Filled 2021-11-10: qty 11.5

## 2021-11-10 MED ORDER — CHLORHEXIDINE GLUCONATE 0.12 % MT SOLN: 15.0000 mL | Freq: Once | OROMUCOSAL | Status: AC

## 2021-11-10 MED ORDER — METHOCARBAMOL 1000 MG/10ML IJ SOLN
500.0000 mg | Freq: Four times a day (QID) | INTRAVENOUS | Status: DC | PRN
  Filled 2021-11-10: qty 5

## 2021-11-10 MED ORDER — ONDANSETRON HCL 4 MG PO TABS
4.0000 mg | ORAL_TABLET | Freq: Four times a day (QID) | ORAL | Status: DC | PRN
Start: 2021-11-10 — End: 2021-11-12
  Administered 2021-11-11: 4 mg via ORAL
  Filled 2021-11-10: qty 1

## 2021-11-10 MED ORDER — DIAZEPAM 5 MG PO TABS: 5.0000 mg | ORAL_TABLET | Freq: Three times a day (TID) | ORAL | Status: DC | PRN

## 2021-11-10 MED ORDER — SURGIFLO WITH THROMBIN (HEMOSTATIC MATRIX KIT) OPTIME
TOPICAL | Status: DC | PRN
  Administered 2021-11-10: 1 via TOPICAL

## 2021-11-10 MED ORDER — LACTATED RINGERS IV SOLN: INTRAVENOUS | Status: DC

## 2021-11-10 MED ORDER — ACETAMINOPHEN 10 MG/ML IV SOLN
INTRAVENOUS | Status: AC
  Filled 2021-11-10: qty 100

## 2021-11-10 MED ORDER — FENTANYL CITRATE (PF) 100 MCG/2ML IJ SOLN
INTRAMUSCULAR | Status: DC | PRN
  Administered 2021-11-10 (×4): 50 ug via INTRAVENOUS

## 2021-11-10 MED ORDER — SODIUM CHLORIDE (PF) 0.9 % IJ SOLN
INTRAMUSCULAR | Status: DC | PRN
  Administered 2021-11-10: 60 mL via INTRAMUSCULAR

## 2021-11-10 MED ORDER — PHENOL 1.4 % MT LIQD: 1.0000 | OROMUCOSAL | Status: DC | PRN

## 2021-11-10 MED ORDER — HYDROMORPHONE HCL 1 MG/ML IJ SOLN
INTRAMUSCULAR | Status: AC
  Filled 2021-11-10: qty 1

## 2021-11-10 MED ORDER — GABAPENTIN 600 MG PO TABS
600.0000 mg | ORAL_TABLET | Freq: Three times a day (TID) | ORAL | Status: DC
  Administered 2021-11-10 – 2021-11-12 (×5): 600 mg via ORAL
  Filled 2021-11-10 (×5): qty 1

## 2021-11-10 MED ORDER — SUCCINYLCHOLINE CHLORIDE 200 MG/10ML IV SOSY
PREFILLED_SYRINGE | INTRAVENOUS | Status: DC | PRN
  Administered 2021-11-10: 120 mg via INTRAVENOUS

## 2021-11-10 MED ORDER — KETAMINE HCL 10 MG/ML IJ SOLN
INTRAMUSCULAR | Status: DC | PRN
  Administered 2021-11-10 (×3): 10 mg via INTRAVENOUS

## 2021-11-10 SURGICAL SUPPLY — 59 items
BASIN KIT SINGLE STR (MISCELLANEOUS) ×2 IMPLANT
BLADE BOVIE TIP EXT 4 (BLADE) ×1 IMPLANT
BUR NEURO DRILL SOFT 3.0X3.8M (BURR) ×2 IMPLANT
CHLORAPREP W/TINT 26 (MISCELLANEOUS) ×3 IMPLANT
CNTNR SPEC 2.5X3XGRAD LEK (MISCELLANEOUS) ×1
CONT SPEC 4OZ STER OR WHT (MISCELLANEOUS) ×1
CONTAINER SPEC 2.5X3XGRAD LEK (MISCELLANEOUS) ×1 IMPLANT
COUNTER NEEDLE 20/40 LG (NEEDLE) ×3 IMPLANT
CUP MEDICINE 2OZ PLAST GRAD ST (MISCELLANEOUS) ×3 IMPLANT
DERMABOND ADVANCED (GAUZE/BANDAGES/DRESSINGS) ×1
DERMABOND ADVANCED .7 DNX12 (GAUZE/BANDAGES/DRESSINGS) ×1 IMPLANT
DRAPE C ARM PK CFD 31 SPINE (DRAPES) ×2 IMPLANT
DRAPE LAPAROTOMY 100X77 ABD (DRAPES) ×2 IMPLANT
DRAPE MICROSCOPE SPINE 48X150 (DRAPES) ×2 IMPLANT
DRAPE SURG 17X11 SM STRL (DRAPES) ×2 IMPLANT
DRSG OPSITE POSTOP 4X6 (GAUZE/BANDAGES/DRESSINGS) ×1 IMPLANT
ELECT CAUTERY BLADE TIP 2.5 (TIP)
ELECT EZSTD 165MM 6.5IN (MISCELLANEOUS)
ELECT REM PT RETURN 9FT ADLT (ELECTROSURGICAL) ×2
ELECTRODE CAUTERY BLDE TIP 2.5 (TIP) ×2 IMPLANT
ELECTRODE EZSTD 165MM 6.5IN (MISCELLANEOUS) IMPLANT
ELECTRODE REM PT RTRN 9FT ADLT (ELECTROSURGICAL) ×1 IMPLANT
GLOVE BIOGEL PI IND STRL 6.5 (GLOVE) ×1 IMPLANT
GLOVE BIOGEL PI IND STRL 8.5 (GLOVE) ×1 IMPLANT
GLOVE BIOGEL PI INDICATOR 6.5 (GLOVE) ×1
GLOVE BIOGEL PI INDICATOR 8.5 (GLOVE) ×1
GLOVE SURG SYN 6.5 ES PF (GLOVE) ×4 IMPLANT
GLOVE SURG SYN 6.5 PF PI (GLOVE) ×2 IMPLANT
GLOVE SURG SYN 8.5  E (GLOVE) ×3
GLOVE SURG SYN 8.5 E (GLOVE) ×3 IMPLANT
GLOVE SURG SYN 8.5 PF PI (GLOVE) ×3 IMPLANT
GOWN SRG LRG LVL 4 IMPRV REINF (GOWNS) ×1 IMPLANT
GOWN SRG XL LVL 3 NONREINFORCE (GOWNS) ×1 IMPLANT
GOWN STRL NON-REIN TWL XL LVL3 (GOWNS) ×1
GOWN STRL REIN LRG LVL4 (GOWNS) ×1
GRADUATE 1200CC STRL 31836 (MISCELLANEOUS) ×2 IMPLANT
GRAFT DURAGEN MATRIX 2WX2L ×1 IMPLANT
KIT SPINAL PRONEVIEW (KITS) ×2 IMPLANT
MANIFOLD NEPTUNE II (INSTRUMENTS) ×2 IMPLANT
MARKER SKIN DUAL TIP RULER LAB (MISCELLANEOUS) ×4 IMPLANT
NDL SAFETY ECLIPSE 18X1.5 (NEEDLE) ×1 IMPLANT
NEEDLE HYPO 18GX1.5 SHARP (NEEDLE) ×1
NS IRRIG 1000ML POUR BTL (IV SOLUTION) ×1 IMPLANT
NS IRRIG 500ML POUR BTL (IV SOLUTION) ×1 IMPLANT
PACK LAMINECTOMY NEURO (CUSTOM PROCEDURE TRAY) ×2 IMPLANT
PAD ARMBOARD 7.5X6 YLW CONV (MISCELLANEOUS) ×2 IMPLANT
PENCIL ELECTRO HAND CTR (MISCELLANEOUS) ×1 IMPLANT
SOLUTION IRRIG SURGIPHOR (IV SOLUTION) ×1 IMPLANT
SURGIFLO W/THROMBIN 8M KIT (HEMOSTASIS) ×2 IMPLANT
SUT DVC VLOC 3-0 CL 6 P-12 (SUTURE) ×2 IMPLANT
SUT VIC AB 0 CT1 27 (SUTURE) ×1
SUT VIC AB 0 CT1 27XCR 8 STRN (SUTURE) ×1 IMPLANT
SUT VIC AB 2-0 CT1 18 (SUTURE) ×3 IMPLANT
SYR 10ML LL (SYRINGE) ×4 IMPLANT
SYR 30ML LL (SYRINGE) ×4 IMPLANT
SYR 3ML LL SCALE MARK (SYRINGE) ×2 IMPLANT
TOWEL OR 17X26 4PK STRL BLUE (TOWEL DISPOSABLE) ×5 IMPLANT
TUBING CONNECTING 10 (TUBING) ×2 IMPLANT
WATER STERILE IRR 1000ML POUR (IV SOLUTION) ×2 IMPLANT

## 2021-11-10 NOTE — Transfer of Care (Signed)
Immediate Anesthesia Transfer of Care Note  Patient: Melissa Black  Procedure(s) Performed: OPEN RIGHT REDO L5-S1 MICRODISCECTOMY (Right: Spine Lumbar)  Patient Location: PACU  Anesthesia Type:General  Level of Consciousness: drowsy  Airway & Oxygen Therapy: Patient Spontanous Breathing and Patient connected to face mask oxygen  Post-op Assessment: Report given to RN and Post -op Vital signs reviewed and stable  Post vital signs: Reviewed and stable  Last Vitals:  Vitals Value Taken Time  BP 117/80 11/10/21 1206  Temp    Pulse    Resp    SpO2    Vitals shown include unvalidated device data.  Last Pain:  Vitals:   11/10/21 0830  TempSrc: Temporal  PainSc: 9          Complications: No notable events documented.

## 2021-11-10 NOTE — Plan of Care (Signed)

## 2021-11-10 NOTE — Interval H&P Note (Signed)
History and Physical Interval Note:  11/10/2021 9:13 AM  Melissa Black  has presented today for surgery, with the diagnosis of m54.16 lumbar radiculopathy r29.898 right leg weakness.  The various methods of treatment have been discussed with the patient and family. After consideration of risks, benefits and other options for treatment, the patient has consented to  Procedure(s): OPEN RIGHT REDO L5-S1 MICRODISCECTOMY (Right) as a surgical intervention.  The patient's history has been reviewed, patient examined, no change in status, stable for surgery.  I have reviewed the patient's chart and labs.  Questions were answered to the patient's satisfaction.     Lydiana Milley

## 2021-11-10 NOTE — Anesthesia Postprocedure Evaluation (Signed)
Anesthesia Post Note  Patient: Melissa Black  Procedure(s) Performed: OPEN RIGHT REDO L5-S1 MICRODISCECTOMY (Right: Spine Lumbar)  Patient location during evaluation: PACU Anesthesia Type: General Level of consciousness: awake and alert Pain management: pain level controlled Vital Signs Assessment: post-procedure vital signs reviewed and stable Respiratory status: spontaneous breathing, nonlabored ventilation, respiratory function stable and patient connected to nasal cannula oxygen Cardiovascular status: blood pressure returned to baseline and stable Postop Assessment: no apparent nausea or vomiting Anesthetic complications: no   No notable events documented.   Last Vitals:  Vitals:   11/10/21 1357 11/10/21 1424  BP:  107/77  Pulse:  69  Resp: 13 18  Temp: (!) 36.1 C (!) 36.4 C  SpO2:      Last Pain:  Vitals:   11/10/21 1424  TempSrc: Oral  PainSc:                  Melissa Black

## 2021-11-10 NOTE — Op Note (Addendum)
Indications: [name] is a 41 yo female who presented with: Right leg weakness and a lumbar radiculopathy.  She previously had a microdiscectomy in May of this year.  Due to worsening weakness, surgery was recommended  Findings: disc herniation  Preoperative Diagnosis: Lumbar Radiculopathy, Right leg weakness Postoperative Diagnosis: same   EBL: 50 ml IVF: 700 ml Drains: none Disposition: Extubated and Stable to PACU Complications: none  No foley catheter was placed.   Preoperative Note:   Risks of surgery discussed include: infection, bleeding, stroke, coma, death, paralysis, CSF leak, nerve/spinal cord injury, numbness, tingling, weakness, complex regional pain syndrome, recurrent stenosis and/or disc herniation, vascular injury, development of instability, neck/back pain, need for further surgery, persistent symptoms, development of deformity, and the risks of anesthesia. They understood these risks and have agreed to proceed.  Operative Note:    The patient was then brought from the preoperative center with intravenous access established.  The patient underwent general anesthesia and endotracheal tube intubation, then was rotated on the [facility] table where all pressure points were appropriately padded.  The prior incision was identified. The skin was then thoroughly cleansed.  Perioperative antibiotic prophylaxis was administered.  Sterile prep and drapes were then applied and a timeout was then observed.    Once this was complete a 5 cm incision was opened with the use of a #10 blade knife.  The paraspinus muscled on the right were subperiosteally dissected until the facet was visualized. Flouroscopy was used to confirm the level. A self-retaining retractor was placed.  The microscope was then sterilely brought into the field. Using a high speed drill, the remainder of the right L5/S1 laminectomy and medial facet were drilled until ligamentum flavum was visualized. After the  laminoforaminotomy was performed, a curette was used to dissect the ligamentum flavum until the epidural fat and dura were visualized. The ligamentum was then carefully removed with 34mm Kerrison punch and rongeurs.   Once the underlying dura was visualized a Penfield 4 was then used to dissect and expose the traversing nerve root.  Once this was identified a nerve root retractor was used to mobilize this medially.  The venous plexus was hemostased with Surgifoam and light bipolar use.  The disc bulge was dissected and then opened. The extruded disc fragment was then dissected free with microsurgical instruments and removed with a micropituitary rongeur.  Once the thecal sac and nerve root were noted to be relaxed and under less tension the ball-tipped feeler and Centracare were passed along the foramen distally to to ensure no residual compression was noted.  With none noted attention was then turned to closure.  Due to the prior dural defect, a duragen spend and vistaseal were used to seal the epidural space.  Hemostasis was confirmed. The retractor was then removed and hemostasis achieved in the superficial tissues. The wound was copiously irrigated.  The fascial layer was reapproximated with the use of a 0- Vicryl suture.  Subcutaneous tissue layer was reapproximated using 2-0 Vicryl suture.  The skin was then cleansed and Dermabond was used to close the skin opening.  Patient was then rotated back to the preoperative bed awakened from anesthesia and taken to recovery all counts are correct in this case.  I performed the entire procedure with the assistance of Manning Charity PA as an Designer, television/film set.An assistant was required for this procedure due to the complexity.  The assistant provided assistance in tissue manipulation and suction, and was required for the successful and safe  performance of the procedure. I performed the critical portions of the procedure.    Venetia Night MD

## 2021-11-10 NOTE — Discharge Instructions (Signed)
  Your surgeon has performed an operation on your lumbar spine (low back) to relieve pressure on one or more nerves. Many times, patients feel better immediately after surgery and can "overdo it." Even if you feel well, it is important that you follow these activity guidelines. If you do not let your back heal properly from the surgery, you can increase the chance of a disc herniation and/or return of your symptoms. The following are instructions to help in your recovery once you have been discharged from the hospital.  Activity    No bending, lifting, or twisting ("BLT"). Avoid lifting objects heavier than 10 pounds (gallon milk jug).  Where possible, avoid household activities that involve lifting, bending, pushing, or pulling such as laundry, vacuuming, grocery shopping, and childcare. Try to arrange for help from friends and family for these activities while your back heals.  Increase physical activity slowly as tolerated.  Taking short walks is encouraged, but avoid strenuous exercise. Do not jog, run, bicycle, lift weights, or participate in any other exercises unless specifically allowed by your doctor. Avoid prolonged sitting, including car rides.  Talk to your doctor before resuming sexual activity.  You should not drive until cleared by your doctor.  Until released by your doctor, you should not return to work or school.  You should rest at home and let your body heal.   You may shower two days after your surgery.  After showering, lightly dab your incision dry. Do not take a tub bath or go swimming for 3 weeks, or until approved by your doctor at your follow-up appointment.  If you smoke, we strongly recommend that you quit.  Smoking has been proven to interfere with normal healing in your back and will dramatically reduce the success rate of your surgery. Please contact [facility] (800-QUIT-NOW) and use the resources at www.[facility].com for assistance in stopping smoking.  Surgical  Incision   If you have a dressing on your incision, you may remove it three days after your surgery. Keep your incision area clean and dry.  Your incision was closed with Dermabond glue. The sglue should begin to peel away within about a week (it is fine if the steri-strips fall off before then).  Diet            You may return to your usual diet. Be sure to stay hydrated.  When to Contact us  Although your surgery and recovery will likely be uneventful, you may have some residual numbness, aches, and pains in your back and/or legs. This is normal and should improve in the next few weeks.  However, should you experience any of the following, contact us immediately: New numbness or weakness Pain that is progressively getting worse, and is not relieved by your pain medications or rest Bleeding, redness, swelling, pain, or drainage from surgical incision Chills or flu-like symptoms Fever greater than 101.0 F (38.3 C) Problems with bowel or bladder functions Difficulty breathing or shortness of breath Warmth, tenderness, or swelling in your calf  Contact Information During office hours (Monday-Friday 9 am to 5 pm), please call your physician at [phone] After hours and weekends, please call [phone] and speak with the answering service, who will contact the doctor on call.  If that fails, call the [facility] Operator at [phone] and ask for the Neurosurgery Resident On Call  For a life-threatening emergency, call 911

## 2021-11-10 NOTE — Anesthesia Procedure Notes (Addendum)
Procedure Name: Intubation Date/Time: 11/10/2021 9:51 AM  Performed by: Lynden Oxford, CRNAPre-anesthesia Checklist: Patient identified, Patient being monitored, Timeout performed, Emergency Drugs available and Suction available Patient Re-evaluated:Patient Re-evaluated prior to induction Oxygen Delivery Method: Circle system utilized Preoxygenation: Pre-oxygenation with 100% oxygen Induction Type: IV induction Ventilation: Mask ventilation without difficulty Laryngoscope Size: 3 and McGraph Grade View: Grade I Tube type: Oral Tube size: 7.0 mm Number of attempts: 1 Airway Equipment and Method: Stylet and Video-laryngoscopy Placement Confirmation: ETT inserted through vocal cords under direct vision, positive ETCO2 and breath sounds checked- equal and bilateral Secured at: 22 cm Tube secured with: Tape Dental Injury: Teeth and Oropharynx as per pre-operative assessment

## 2021-11-10 NOTE — Anesthesia Preprocedure Evaluation (Signed)
Anesthesia Evaluation  Patient identified by MRN, date of birth, ID band Patient awake    Reviewed: Allergy & Precautions, NPO status , Patient's Chart, lab work & pertinent test results  History of Anesthesia Complications Negative for: history of anesthetic complications  Airway Mallampati: III  TM Distance: >3 FB Neck ROM: full    Dental  (+) Chipped   Pulmonary asthma , former smoker,    Pulmonary exam normal        Cardiovascular Exercise Tolerance: Good (-) anginanegative cardio ROS Normal cardiovascular exam     Neuro/Psych  Neuromuscular disease negative psych ROS   GI/Hepatic negative GI ROS, Neg liver ROS, neg GERD  ,  Endo/Other  negative endocrine ROS  Renal/GU      Musculoskeletal   Abdominal   Peds  Hematology negative hematology ROS (+)   Anesthesia Other Findings Past Medical History: No date: ADHD (attention deficit hyperactivity disorder) No date: Anemia     Comment:  h/o No date: Anxiety No date: Asthma No date: Chest pain, unspecified No date: Genital herpes No date: History of chlamydia No date: History of gonorrhea No date: History of marijuana use No date: Obesity No date: SOB (shortness of breath) No date: Spinal stenosis of lumbar region with radiculopathy  Past Surgical History: No date: CESAREAN SECTION     Comment:  x1 No date: FOOT SURGERY; Right     Comment:  broken toe 08/30/2021: LUMBAR LAMINECTOMY/DECOMPRESSION MICRODISCECTOMY; Right     Comment:  Procedure: RIGHT L4-5 LAMINOFORAMINOTOMY, RIGHT L5-S1               MICRODISCECTOMY;  Surgeon: Venetia Night, MD;                Location: [facility] ORS;  Service: Neurosurgery;  Laterality:               Right;  BMI    Body Mass Index: 31.95 kg/m      Reproductive/Obstetrics negative OB ROS                             Anesthesia Physical Anesthesia Plan  ASA: 3  Anesthesia Plan: General  ETT   Post-op Pain Management:    Induction: Intravenous  PONV Risk Score and Plan: Ondansetron, Dexamethasone, Midazolam and Treatment may vary due to age or medical condition  Airway Management Planned: Oral ETT  Additional Equipment:   Intra-op Plan:   Post-operative Plan: Extubation in OR  Informed Consent: I have reviewed the patients History and Physical, chart, labs and discussed the procedure including the risks, benefits and alternatives for the proposed anesthesia with the patient or authorized representative who has indicated his/her understanding and acceptance.     Dental Advisory Given  Plan Discussed with: Anesthesiologist, CRNA and Surgeon  Anesthesia Plan Comments: (Patient consented for risks of anesthesia including but not limited to:  - adverse reactions to medications - damage to eyes, teeth, lips or other oral mucosa - nerve damage due to positioning  - sore throat or hoarseness - Damage to heart, brain, nerves, lungs, other parts of body or loss of life  Patient voiced understanding.)        Anesthesia Quick Evaluation

## 2021-11-10 NOTE — TOC Initial Note (Signed)
Transition of Care [facility]) - Initial/Assessment Note    Patient Details  Name: Melissa Black MRN: [id number] Date of Birth: Jan 06, 1981  Transition of Care [facility]) CM/SW Contact:    Marlowe Sax, RN Phone Number: 11/10/2021, 11:51 AM  Clinical Narrative:                   Transition of Care [facility]) Screening Note   Patient Details  Name: Melissa Black Date of Birth: [birth year]   Transition of Care [facility]) CM/SW Contact:    Marlowe Sax, RN Phone Number: 11/10/2021, 11:51 AM    Transition of Care Department [facility]) has reviewed patient and no TOC needs have been identified at this time. We will continue to monitor patient advancement through interdisciplinary progression rounds. If new patient transition needs arise, please place a TOC consult.         Patient Goals and CMS Choice        Expected Discharge Plan and Services                                                Prior Living Arrangements/Services                       Activities of Daily Living Home Assistive Devices/Equipment: None ADL Screening (condition at time of admission) Is the patient deaf or have difficulty hearing?: No Does the patient have difficulty seeing, even when wearing glasses/contacts?: No Does the patient have difficulty concentrating, remembering, or making decisions?: No Patient able to express need for assistance with ADLs?: Yes Does the patient have difficulty dressing or bathing?: Yes Independently performs ADLs?: No Communication: Needs assistance Dressing (OT): Needs assistance Grooming: Needs assistance Feeding: Needs assistance Bathing: Needs assistance Toileting: Needs assistance In/Out Bed: Needs assistance Walks in Home: Needs assistance Does the patient have difficulty walking or climbing stairs?: Yes Weakness of Legs: Right Weakness of Arms/Hands: None  Permission Sought/Granted                  Emotional Assessment               Admission diagnosis:  m54.16 lumbar radiculopathy r29.898 right leg weakness There are no problems to display for this patient.  PCP:  [facility]:   [id number] - [place], [place] - [address] [place] [address] Phone: [phone] Fax: [phone]  [facility] - [place], [place] - [address] [place] [address] Phone: [phone] Fax: [phone]  [facility], [place], [place] - [address] [place] [address] Phone: [phone] Fax: [phone]     Social Determinants of Health (SDOH) Interventions    Readmission Risk Interventions     No data to display

## 2021-11-11 ENCOUNTER — Encounter: Payer: Self-pay | Admitting: Neurosurgery

## 2021-11-11 DIAGNOSIS — M5117 Intervertebral disc disorders with radiculopathy, lumbosacral region: Secondary | ICD-10-CM | POA: Diagnosis not present

## 2021-11-11 MED ORDER — METHOCARBAMOL 1000 MG/10ML IJ SOLN
500.0000 mg | Freq: Four times a day (QID) | INTRAVENOUS | Status: DC
  Filled 2021-11-11: qty 5

## 2021-11-11 MED ORDER — METHOCARBAMOL 500 MG PO TABS
500.0000 mg | ORAL_TABLET | Freq: Four times a day (QID) | ORAL | Status: DC
  Administered 2021-11-11 – 2021-11-12 (×5): 500 mg via ORAL
  Filled 2021-11-11 (×5): qty 1

## 2021-11-11 MED ORDER — ACETAMINOPHEN 500 MG PO TABS
1000.0000 mg | ORAL_TABLET | Freq: Four times a day (QID) | ORAL | Status: DC
  Administered 2021-11-11 – 2021-11-12 (×5): 1000 mg via ORAL
  Filled 2021-11-11 (×6): qty 2

## 2021-11-11 NOTE — Evaluation (Addendum)
Physical Therapy Evaluation Patient Details Name: Melissa Black MRN: [id number] DOB: 1981-01-27 Today's Date: 11/11/2021  History of Present Illness  Pt is s/p redo right L5-S1 discectomy.  PMH includes: anemia, anxiety, asthma, chest pain, genital herpes, chlamydia, gonorrhea, marijuan use, obesity, SOB, spinal steonisis of lumbar region with R sided radiculopathy.  Pt also had a lumbar laminectomy/decompression microdeiscectomy on the R side of the L4-L5 spinal region.   Clinical Impression  Pt received in Semi-Fowler's position and agreeable to therapy.  Pt notes she has improved sensation of the R LE, but it is still somewhat numb.  Dermatomal testing performed and pt had good sensation along B LE's.  Pt performed bed-mobility with good technique after verbal cuing was given.  Pt abided by spinal precautions, and performed well with transition to standing.  Pt does require slightly elevated bed to assist, but otherwise, pt able to perform.  Pt also demonstrates significant muscle shaking in the UE's when in standing that may be due to weakness.  MMT of the UE's did not reveal any major deficits however.   Pt then ambulated to the bathroom door and then back to the recliner where she was left with all needs met and call bell within reach.  Pt to continue with HEP in order to increase strength and improve overall QoL.  Current discharge plans to home with HHPT are appropriate at this time, as pt was able to manage mobility at home prior to surgery and she notes she is moving better now than she was prior to the surgery.  Pt will continue to benefit from skilled therapy in order to address deficits listed below.      Recommendations for follow up therapy are one component of a multi-disciplinary discharge planning process, led by the attending physician.  Recommendations may be updated based on patient status, additional functional criteria and insurance authorization.  Follow Up Recommendations  Home health PT      Assistance Recommended at Discharge Intermittent Supervision/Assistance  Patient can return home with the following  A lot of help with walking and/or transfers;A lot of help with bathing/dressing/bathroom;Assistance with cooking/housework;Help with stairs or ramp for entrance    Equipment Recommendations Rolling walker (2 wheels);BSC/3in1  Recommendations for Other Services       Functional Status Assessment Patient has had a recent decline in their functional status and demonstrates the ability to make significant improvements in function in a reasonable and predictable amount of time.     Precautions / Restrictions        Mobility  Bed Mobility Overal bed mobility: Needs Assistance Bed Mobility: Supine to Sit     Supine to sit: Supervision     General bed mobility comments: pt required handrails in order to perform bed mobility    Transfers Overall transfer level: Needs assistance Equipment used: Rolling walker (2 wheels) Transfers: Sit to/from Stand Sit to Stand: Min assist           General transfer comment: pt required minA in order to perform STS and is shaky theoughout transition.    Ambulation/Gait Ambulation/Gait assistance: Min guard Gait Distance (Feet): 12 Feet Assistive device: Rolling walker (2 wheels) Gait Pattern/deviations: Step-to pattern, Decreased step length - left, Decreased stance time - right, Trunk flexed Gait velocity: decreased     General Gait Details: pt has trunk flexed posture during ambulation and unable to take larger steps with the R LE.  Stairs  Wheelchair Mobility    Modified Rankin (Stroke Patients Only)       Balance Overall balance assessment: Needs assistance Sitting-balance support: Feet supported, No upper extremity supported Sitting balance-Leahy Scale: Good     Standing balance support: Single extremity supported, Bilateral upper extremity supported, Reliant on  assistive device for balance, During functional activity Standing balance-Leahy Scale: Fair Standing balance comment: Pt able to balance with one UE supported in standing, but requires assistance with mobility.                             Pertinent Vitals/Pain Pain Assessment Pain Assessment: 0-10 Pain Score: 9  Pain Location: 9 in R buttock and Lumbar Spine, 5 in R LE Pain Descriptors / Indicators: Sharp, Throbbing Pain Intervention(s): Limited activity within patient's tolerance, Monitored during session, Premedicated before session    Home Living                          Prior Function                       Hand Dominance        Extremity/Trunk Assessment                Communication      Cognition Arousal/Alertness: Awake/alert Behavior During Therapy: WFL for tasks assessed/performed Overall Cognitive Status: Within Functional Limits for tasks assessed                                          General Comments      Exercises     Assessment/Plan    PT Assessment Patient needs continued PT services  PT Problem List Decreased strength;Decreased activity tolerance;Decreased balance;Decreased mobility;Decreased knowledge of use of DME;Decreased safety awareness       PT Treatment Interventions DME instruction;Gait training;Stair training;Functional mobility training;Therapeutic activities;Therapeutic exercise;Balance training;Neuromuscular re-education    PT Goals (Current goals can be found in the Care Plan section)  Acute Rehab PT Goals Patient Stated Goal: to increased R LE strength PT Goal Formulation: With patient Time For Goal Achievement: 11/25/21 Potential to Achieve Goals: Good    Frequency 7X/week     Co-evaluation               AM-PAC PT "6 Clicks" Mobility  Outcome Measure Help needed turning from your back to your side while in a flat bed without using bedrails?: A Little Help  needed moving from lying on your back to sitting on the side of a flat bed without using bedrails?: A Little Help needed moving to and from a bed to a chair (including a wheelchair)?: A Little Help needed standing up from a chair using your arms (e.g., wheelchair or bedside chair)?: A Little Help needed to walk in hospital room?: A Little Help needed climbing 3-5 steps with a railing? : Total 6 Click Score: 16    End of Session Equipment Utilized During Treatment: Gait belt Activity Tolerance: Patient tolerated treatment well Patient left: in chair;with call bell/phone within reach;with chair alarm set;with family/visitor present Nurse Communication: Mobility status PT Visit Diagnosis: Unsteadiness on feet (R26.81);Other abnormalities of gait and mobility (R26.89);Muscle weakness (generalized) (M62.81);Difficulty in walking, not elsewhere classified (R26.2);Pain Pain - Right/Left: Right Pain - part of body:  (R Sided-Lumbar Spine)  Time: 2820-6015 PT Time Calculation (min) (ACUTE ONLY): 40 min   Charges:   PT Evaluation $PT Eval Low Complexity: 1 Low PT Treatments $Therapeutic Exercise: 8-22 mins $Therapeutic Activity: 8-22 mins        Gwenlyn Saran, PT, DPT 11/11/21, 12:53 PM

## 2021-11-11 NOTE — Progress Notes (Addendum)
Assumed care of patient at 2300. Aox4. She reports 4/10 back pain which radiates to right buttock and right leg. No acute distress noted. Plan of care reviewed with patient. Call bell within reach.  Last bowel movement 11/06/21

## 2021-11-11 NOTE — Progress Notes (Signed)
    Attending Progress Note  History: Melissa Black is s/p redo right L5-S1 discectomy   POD1: pt expresses expected back pain with some radiation into right buttock but admits to improved pain in distal RLE.   Physical Exam: Vitals:   11/10/21 1959 11/11/21 0424  BP: 110/71 119/78  Pulse: 71 71  Resp: 16 15  Temp: 98.4 F (36.9 C) 98.1 F (36.7 C)  SpO2: 92% 98%    AA Ox3 CNI  Strength:5/5 throughout LLE. 5/5 right HF and KE 4- right DF, EHL, PF  Data:  Recent Labs  Lab 11/08/21 1135  NA 139  K 3.9  CL 110  CO2 23  BUN 15  CREATININE 0.86  GLUCOSE 136*  CALCIUM 8.4*   No results for input(s): "AST", "ALT", "ALKPHOS" in the last 168 hours.  Invalid input(s): "TBILI"   Recent Labs  Lab 11/08/21 1135  WBC 10.7*  HGB 15.0  HCT 45.3  PLT 298   No results for input(s): "APTT", "INR" in the last 168 hours.       Other tests/results: none   Assessment/Plan:  Melissa Black is a 41 y.o s/p redo discectomy for lumbar radiculopathy   - mobilize - pain control - DVT prophylaxis - PTOT  Manning Charity PA-C Department of Neurosurgery

## 2021-11-11 NOTE — Evaluation (Signed)
Occupational Therapy Evaluation Patient Details Name: Melissa Black MRN: [id number] DOB: [birth year] Today's Date: 11/11/2021   History of Present Illness Pt is s/p redo right L5-S1 discectomy.  PMH includes: anemia, anxiety, asthma, chest pain, genital herpes, chlamydia, gonorrhea, marijuan use, obesity, SOB, spinal steonisis of lumbar region with R sided radiculopathy.  Pt also had a lumbar laminectomy/decompression microdeiscectomy on the R side of the L4-L5 spinal region.   Clinical Impression   Patient presenting with decreased Ind in self care,balance, functional mobility/transfers, endurance, and safety awareness. Patient reports she has needed assistance for self care tasks for the last several years from 41 y/o daughter. 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 Patient is tremulous when standing and mobilizing but likely secondary to anxiety. When pt is cued for pursed lip breathing or distraction it is much better. Min A transfer to [facility] and pt able to perform hygiene with min guard. Pt  then ambulating short distance back to bed at end of session with RW and min guard. Patient will benefit from acute OT to increase overall independence in the areas of ADLs, functional mobility, and safety awareness in order to safely discharge home with family.     Recommendations for follow up therapy are one component of a multi-disciplinary discharge planning process, led by the attending physician.  Recommendations may be updated based on patient status, additional functional criteria and insurance authorization.   Follow Up Recommendations  Home health OT    Assistance Recommended at Discharge Intermittent Supervision/Assistance  Patient can return home with the following A little help with walking and/or transfers;A little help with bathing/dressing/bathroom;Help with stairs or ramp for entrance;Assist for transportation;Assistance with cooking/housework    Functional Status  Assessment  Patient has had a recent decline in their functional status and demonstrates the ability to make significant improvements in function in a reasonable and predictable amount of time.  Equipment Recommendations  BSC/3in1;Other (comment) (RW)       Precautions / Restrictions Precautions Precautions: None Restrictions Weight Bearing Restrictions: No      Mobility Bed Mobility Overal bed mobility: Needs Assistance Bed Mobility: Sit to Supine       Sit to supine: Mod assist   General bed mobility comments: assist for B LEs    Transfers Overall transfer level: Needs assistance Equipment used: Rolling walker (2 wheels) Transfers: Sit to/from Stand, Bed to chair/wheelchair/BSC Sit to Stand: Min assist     Step pivot transfers: Min assist     General transfer comment: pt required minA in order to perform STS and tremulous throughout transition.      Balance Overall balance assessment: Needs assistance Sitting-balance support: Feet supported, No upper extremity supported Sitting balance-Leahy Scale: Good     Standing balance support: Bilateral upper extremity supported, Reliant on assistive device for balance, During functional activity Standing balance-Leahy Scale: Fair                             ADL either performed or assessed with clinical judgement   ADL Overall ADL's : Needs assistance/impaired                         Toilet Transfer: Minimal assistance;BSC/3in1;Ambulation;Rolling walker (2 wheels)   Toileting- Clothing Manipulation and Hygiene: Min guard;Sit to/from stand       Functional mobility during ADLs: Minimal assistance;Min guard;Rolling walker (2 wheels)  Vision Patient Visual Report: No change from baseline              Pertinent Vitals/Pain Pain Assessment Pain Assessment: 0-10 Pain Score: 7  Pain Location: R buttock and LE Pain Descriptors / Indicators: Sharp, Throbbing Pain Intervention(s):  Monitored during session, Premedicated before session     Hand Dominance     Extremity/Trunk Assessment Upper Extremity Assessment Upper Extremity Assessment: Overall WFL for tasks assessed   Lower Extremity Assessment Lower Extremity Assessment: Defer to PT evaluation       Communication Communication Communication: No difficulties   Cognition Arousal/Alertness: Awake/alert Behavior During Therapy: WFL for tasks assessed/performed Overall Cognitive Status: Within Functional Limits for tasks assessed                                                  Home Living Family/patient expects to be discharged to:: Private residence Living Arrangements: Spouse/significant other;Children;Parent Available Help at Discharge: Family;Available 24 hours/day Type of Home: Apartment Home Access: Stairs to enter Entrance Stairs-Number of Steps: 2 Entrance Stairs-Rails: None Home Layout: One level     Bathroom Shower/Tub: Chief Strategy Officer: Standard Bathroom Accessibility: Yes   Home Equipment: None          Prior Functioning/Environment Prior Level of Function : Needs assist               ADLs Comments: Pt reports needing assistance with self care and IADLs for last 3 years and was working as a Lawyer until 3 months ago.        OT Problem List: Decreased strength;Pain;Decreased activity tolerance;Decreased safety awareness;Impaired balance (sitting and/or standing);Decreased knowledge of use of DME or AE;Decreased knowledge of precautions      OT Treatment/Interventions: Self-care/ADL training;Therapeutic exercise;Therapeutic activities;Energy conservation;DME and/or AE instruction;Manual therapy;Modalities;Balance training;Patient/family education    OT Goals(Current goals can be found in the care plan section) Acute Rehab OT Goals Patient Stated Goal: to go home OT Goal Formulation: With patient Time For Goal Achievement:  11/25/21 Potential to Achieve Goals: Fair ADL Goals Pt Will Perform Grooming: standing;with supervision Pt Will Perform Lower Body Dressing: with supervision;sit to/from stand Pt Will Transfer to Toilet: with supervision;ambulating Pt Will Perform Toileting - Clothing Manipulation and hygiene: with supervision;sit to/from stand  OT Frequency: Min 2X/week       AM-PAC OT "6 Clicks" Daily Activity     Outcome Measure Help from another person eating meals?: None Help from another person taking care of personal grooming?: A Little Help from another person toileting, which includes using toliet, bedpan, or urinal?: A Little Help from another person bathing (including washing, rinsing, drying)?: A Little Help from another person to put on and taking off regular upper body clothing?: A Little Help from another person to put on and taking off regular lower body clothing?: A Little 6 Click Score: 19   End of Session Equipment Utilized During Treatment: Rolling walker (2 wheels);Other (comment) [facility]) Nurse Communication: Mobility status  Activity Tolerance: Patient tolerated treatment well Patient left: in bed;with call bell/phone within reach;with bed alarm set  OT Visit Diagnosis: Unsteadiness on feet (R26.81);Repeated falls (R29.6);Muscle weakness (generalized) (M62.81)                Time: 0630-1601 OT Time Calculation (min): 27 min Charges:  OT General Charges $OT Visit: 1 Visit OT  Evaluation $OT Eval Moderate Complexity: 1 Mod OT Treatments $Self Care/Home Management : 8-22 mins  Jackquline Denmark, MS, OTR/L , CBIS ascom [phone]  11/11/21, 1:33 PM

## 2021-11-11 NOTE — Plan of Care (Signed)

## 2021-11-11 NOTE — TOC Progression Note (Signed)
Transition of Care [facility]) - Progression Note    Patient Details  Name: Melissa Black MRN: [id number] Date of Birth: May 22, 1980  [facility]) CM/SW Contact  Marlowe Sax, RN Phone Number: 11/11/2021, 10:24 AM  Clinical Narrative:    Spoke with the patient  She will have HH thru [facility] She will get a RW and 3 in 1 delivered to the room by [facility] Her mother and daughter will provide transportation   Expected Discharge Plan: Home w Home Health Services Barriers to Discharge: Barriers Resolved  Expected Discharge Plan and Services Expected Discharge Plan: Home w Home Health Services   Discharge Planning Services: CM Consult   Living arrangements for the past 2 months: Single Family Home                 DME Arranged: Dan Humphreys rolling, 3-N-1 DME Agency: [facility] Date DME Agency Contacted: 11/11/21 Time DME Agency Contacted: 1019 Representative spoke with at DME Agency: Bjorn Loser HH Arranged: PT HH Agency: [facility] Date [facility] Agency Contacted: 11/11/21 Time HH Agency Contacted: 1020 Representative spoke with at [facility] Agency: Meg   Social Determinants of Health (SDOH) Interventions    Readmission Risk Interventions     No data to display

## 2021-11-11 NOTE — Plan of Care (Signed)
  Problem: Education: Goal: Knowledge of General Education information will improve Description: Including pain rating scale, medication(s)/side effects and non-pharmacologic comfort measures Outcome: Progressing   Problem: Health Behavior/Discharge Planning: Goal: Ability to manage health-related needs will improve Outcome: Progressing   Problem: Activity: Goal: Risk for activity intolerance will decrease Outcome: Progressing   Problem: Nutrition: Goal: Adequate nutrition will be maintained Outcome: Progressing   Problem: Coping: Goal: Level of anxiety will decrease Outcome: Progressing   Problem: Elimination: Goal: Will not experience complications related to bowel motility Outcome: Progressing   Problem: Pain Managment: Goal: General experience of comfort will improve Outcome: Progressing   

## 2021-11-11 NOTE — Progress Notes (Cosign Needed)
Patient is not able to walk the distance required to go the bathroom, or he/she is unable to safely negotiate stairs required to access the bathroom.  A 3in1 BSC will alleviate this problem  

## 2021-11-12 ENCOUNTER — Telehealth: Payer: Self-pay

## 2021-11-12 ENCOUNTER — Other Ambulatory Visit: Payer: Self-pay | Admitting: Neurosurgery

## 2021-11-12 DIAGNOSIS — M5117 Intervertebral disc disorders with radiculopathy, lumbosacral region: Secondary | ICD-10-CM | POA: Diagnosis not present

## 2021-11-12 MED ORDER — BISACODYL 10 MG RE SUPP
10.0000 mg | Freq: Every day | RECTAL | Status: DC | PRN
Start: 2021-11-12 — End: 2021-11-12

## 2021-11-12 MED ORDER — LACTULOSE 10 GM/15ML PO SOLN
20.0000 g | Freq: Two times a day (BID) | ORAL | Status: DC | PRN
  Administered 2021-11-12: 20 g via ORAL
  Filled 2021-11-12: qty 30

## 2021-11-12 MED ORDER — MINERAL OIL PO OIL
960.0000 mL | TOPICAL_OIL | Freq: Once | ORAL | Status: DC | PRN
Start: 2021-11-12 — End: 2021-11-12

## 2021-11-12 MED ORDER — OXYCODONE HCL 5 MG PO TABS: 5.0000 mg | ORAL_TABLET | ORAL | 0 refills | Status: AC | PRN

## 2021-11-12 MED ORDER — SENNOSIDES-DOCUSATE SODIUM 8.6-50 MG PO TABS
2.0000 | ORAL_TABLET | Freq: Two times a day (BID) | ORAL | Status: DC
  Administered 2021-11-12: 2 via ORAL
  Filled 2021-11-12: qty 2

## 2021-11-12 MED ORDER — METHOCARBAMOL 500 MG PO TABS: 500.0000 mg | ORAL_TABLET | Freq: Four times a day (QID) | ORAL | 0 refills | Status: DC | PRN

## 2021-11-12 MED ORDER — SENNOSIDES-DOCUSATE SODIUM 8.6-50 MG PO TABS: 2.0000 | ORAL_TABLET | Freq: Two times a day (BID) | ORAL | 0 refills | Status: DC

## 2021-11-12 NOTE — Telephone Encounter (Signed)
-----   Message from Rockey Situ sent at 11/12/2021  2:46 PM EDT ----- Regarding: postop pain medication Contact: [phone] Patient calling that she was discharged today from the hospital with out a prescription for oxycodone. She has 5 pills left from previous refill. [facility]

## 2021-11-12 NOTE — Discharge Summary (Signed)
Physician Discharge Summary  Patient ID: Bitha Fauteux MRN: [id number] DOB/AGE: 1981/04/15 41 y.o.  Admit date: 11/10/2021 Discharge date: 11/12/2021  Admission Diagnoses: Lumbar radiculopathy  Discharge Diagnoses:  Principal Problem:   Lumbar radiculopathy   Discharged Condition: good  Hospital Course: [name] presented with recurrent radiculopathy and weakness.  She underwent microdiscectomy and did well.  She was kept in hospital for observation and for work with physical therapy.  Consults: None  Significant Diagnostic Studies: radiology: X-Ray: localization  Treatments: surgery: Redo R L5-S1 microdiscectomy  Discharge Exam: Blood pressure 121/75, pulse 72, temperature 98.4 F (36.9 C), resp. rate 15, height 5\' 7"  (1.702 m), weight 92.5 kg, last menstrual period 09/21/2021, SpO2 98 %. General appearance: alert and cooperative MAEW as per today's note  Disposition: Discharge disposition: 01-Home or Self Care       Discharge Instructions     Discharge patient   Complete by: As directed    Discharge disposition: 01-Home or Self Care   Discharge patient date: 11/12/2021   Incentive spirometry RT   Complete by: As directed       Allergies as of 11/12/2021       Reactions   Codeine Swelling   Penicillins Swelling, Other (See Comments)   TOLERATED CEFAZOLIN Has patient had a PCN reaction causing immediate rash, facial/tongue/throat swelling, SOB or lightheadedness with hypotension: yes Has patient had a PCN reaction causing severe rash involving mucus membranes or skin necrosis: no Has patient had a PCN reaction that required hospitalization no Has patient had a PCN reaction occurring within the last 10 years: no If all of the above answers are "NO", then may proceed with Cephalosporin use.   Benadryl [diphenhydramine] Hives   Bupropion Palpitations   Heart palpitations.        Medication List     STOP taking these medications     medroxyPROGESTERone 150 MG/ML injection Commonly known as: DEPO-PROVERA   senna 8.6 MG Tabs tablet Commonly known as: SENOKOT       TAKE these medications    albuterol 108 (90 Base) MCG/ACT inhaler Commonly known as: VENTOLIN HFA Inhale 1-2 puffs into the lungs every 6 (six) hours as needed for wheezing or shortness of breath.   ciprofloxacin 500 MG tablet Commonly known as: Cipro Take 1 tablet (500 mg total) by mouth 2 (two) times daily for 3 days. Increase WATER intake while taking this medication.   diazepam 5 MG tablet Commonly known as: VALIUM Take 5 mg by mouth every 8 (eight) hours as needed for anxiety.   DULoxetine 60 MG capsule Commonly known as: CYMBALTA Take 60 mg by mouth in the morning.   gabapentin 600 MG tablet Commonly known as: Neurontin Take 1.5 tablets (900 mg total) by mouth 3 (three) times daily.   methocarbamol 500 MG tablet Commonly known as: ROBAXIN Take 1 tablet (500 mg total) by mouth every 6 (six) hours as needed for muscle spasms.   multivitamin with minerals Tabs tablet Take 1 tablet by mouth in the morning. One A Day Multivitamin for Women   oxyCODONE 5 MG immediate release tablet Commonly known as: Roxicodone Take 1 tablet (5 mg total) by mouth every 6 (six) hours as needed for up to 15 days for moderate pain or severe pain.   senna-docusate 8.6-50 MG tablet Commonly known as: Senokot-S Take 2 tablets by mouth 2 (two) times daily.               Durable Medical Equipment  (  From admission, onward)           Start     Ordered   11/11/21 1020  For home use only DME 3 n 1  Once        11/11/21 1019   11/11/21 1019  For home use only DME Walker rolling  Once       Question Answer Comment  Walker: With 5 Inch Wheels   Patient needs a walker to treat with the following condition Generalized weakness      11/11/21 1019            Follow-up Information     Susanne Borders, PA Follow up in 2 week(s).    Specialty: Neurosurgery Why: For post-op and incision check Contact information: [address] [place] [address] [phone]                 Signed: Venetia Night 11/12/2021, 8:27 AM

## 2021-11-12 NOTE — Progress Notes (Signed)
   Attending Progress Note  History: Bedelia Pong is s/p redo right L5-S1 discectomy   POD2: Her RLE pain is gone.  She has not had a BM in 6 days.  POD1: pt expresses expected back pain with some radiation into right buttock but admits to improved pain in distal RLE.   Physical Exam: Vitals:   11/12/21 0344 11/12/21 0804  BP: 117/73 121/75  Pulse: 69 72  Resp: 15   Temp: 98.3 F (36.8 C) 98.4 F (36.9 C)  SpO2: 100% 98%    AA Ox3 CNI  Strength:5/5 throughout LLE. 5/5 right HF and KE 4 right DF, EHL, PF  Data:  Recent Labs  Lab 11/08/21 1135  NA 139  K 3.9  CL 110  CO2 23  BUN 15  CREATININE 0.86  GLUCOSE 136*  CALCIUM 8.4*   No results for input(s): "AST", "ALT", "ALKPHOS" in the last 168 hours.  Invalid input(s): "TBILI"   Recent Labs  Lab 11/08/21 1135  WBC 10.7*  HGB 15.0  HCT 45.3  PLT 298   No results for input(s): "APTT", "INR" in the last 168 hours.       Other tests/results: none   Assessment/Plan:  Erin Obando is a 41 y.o s/p redo discectomy for lumbar radiculopathy   - mobilize - pain control - DVT prophylaxis - PTOT - BM today  Venetia Night MD Department of Neurosurgery

## 2021-11-12 NOTE — Progress Notes (Signed)
Patient reported last BM 07/15. MD notified to.  Will continue to monitor.

## 2021-11-12 NOTE — Plan of Care (Signed)

## 2021-11-12 NOTE — Progress Notes (Signed)
Occupational Therapy Treatment Patient Details Name: Melissa Black MRN: [id number] DOB: 20-Aug-1980 Today's Date: 11/12/2021   History of present illness Pt is s/p redo right L5-S1 discectomy.  PMH includes: anemia, anxiety, asthma, chest pain, genital herpes, chlamydia, gonorrhea, marijuan use, obesity, SOB, spinal steonisis of lumbar region with R sided radiculopathy.  Pt also had a lumbar laminectomy/decompression microdeiscectomy on the R side of the L4-L5 spinal region.   OT comments  Melissa Black did well today and expressed that she was thrilled to be able put her clothes on without any physical assistance -- something she hasn't been able to do for several months. During today's session, she performed transfers, ambulation, toileting, grooming in standing, UB and LB dressing in standing, all with SUPV for safety but no physical assistance needed. Her balance is still somewhat impaired, and she is debilitated -- tiring quickly with OOB movement -- but is making good progress. She reports 6/10 pain at her incision site but no pain or numbness in RLE. She is slightly tremulous in standing. DC home with HHOT remains appropriate plan.    Recommendations for follow up therapy are one component of a multi-disciplinary discharge planning process, led by the attending physician.  Recommendations may be updated based on patient status, additional functional criteria and insurance authorization.    Follow Up Recommendations  Home health OT    Assistance Recommended at Discharge Intermittent Supervision/Assistance  Patient can return home with the following  A little help with walking and/or transfers;A little help with bathing/dressing/bathroom;Help with stairs or ramp for entrance;Assist for transportation;Assistance with cooking/housework   Equipment Recommendations       Recommendations for Other Services      Precautions / Restrictions Precautions Precautions:  None Restrictions Weight Bearing Restrictions: No       Mobility Bed Mobility               General bed mobility comments: received/lfet in recliner    Transfers Overall transfer level: Needs assistance Equipment used: Rolling walker (2 wheels) Transfers: Sit to/from Stand, Bed to chair/wheelchair/BSC Sit to Stand: Supervision     Step pivot transfers: Supervision           Balance Overall balance assessment: Needs assistance Sitting-balance support: Feet supported, No upper extremity supported Sitting balance-Leahy Scale: Good     Standing balance support: Bilateral upper extremity supported, During functional activity, Reliant on assistive device for balance, Single extremity supported Standing balance-Leahy Scale: Fair Standing balance comment: fatigues quickly with standing                           ADL either performed or assessed with clinical judgement   ADL Overall ADL's : Needs assistance/impaired     Grooming: Wash/dry hands;Wash/dry face;Standing;Oral care;Supervision/safety           Upper Body Dressing : Supervision/safety;Sitting   Lower Body Dressing: Supervision/safety;Sitting/lateral leans   Toilet Transfer: Supervision/safety;Rolling walker (2 wheels);Regular Toilet   Toileting- Clothing Manipulation and Hygiene: Sit to/from stand;Supervision/safety              Extremity/Trunk Assessment Upper Extremity Assessment Upper Extremity Assessment: Overall WFL for tasks assessed   Lower Extremity Assessment Lower Extremity Assessment: Overall WFL for tasks assessed        Vision       Perception     Praxis      Cognition Arousal/Alertness: Awake/alert Behavior During Therapy: WFL for tasks assessed/performed Overall Cognitive Status:  Within Functional Limits for tasks assessed                                          Exercises      Shoulder Instructions       General Comments       Pertinent Vitals/ Pain       Pain Assessment Pain Score: 6  Pain Location: at incision site Pain Descriptors / Indicators: Sharp Pain Intervention(s): Repositioned  Home Living                                          Prior Functioning/Environment              Frequency  Min 2X/week        Progress Toward Goals  OT Goals(current goals can now be found in the care plan section)  Progress towards OT goals: Progressing toward goals  Acute Rehab OT Goals OT Goal Formulation: With patient Time For Goal Achievement: 11/25/21 Potential to Achieve Goals: Good  Plan Discharge plan remains appropriate;Frequency remains appropriate    Co-evaluation                 AM-PAC OT "6 Clicks" Daily Activity     Outcome Measure   Help from another person eating meals?: None Help from another person taking care of personal grooming?: A Little Help from another person toileting, which includes using toliet, bedpan, or urinal?: A Little Help from another person bathing (including washing, rinsing, drying)?: A Little Help from another person to put on and taking off regular upper body clothing?: A Little Help from another person to put on and taking off regular lower body clothing?: A Little 6 Click Score: 19    End of Session Equipment Utilized During Treatment: Rolling walker (2 wheels)  OT Visit Diagnosis: Unsteadiness on feet (R26.81);Repeated falls (R29.6);Muscle weakness (generalized) (M62.81)   Activity Tolerance Patient tolerated treatment well   Patient Left in chair;with nursing/sitter in room;with call bell/phone within reach   Nurse Communication          Time: 2376-2831 OT Time Calculation (min): 18 min  Charges: OT General Charges $OT Visit: 1 Visit OT Treatments $Self Care/Home Management : 8-22 mins Melissa Craver, PhD, MS, OTR/L 11/12/21, 11:22 AM

## 2021-11-12 NOTE — Plan of Care (Signed)
  Problem: Activity: Goal: Risk for activity intolerance will decrease Outcome: Progressing   

## 2021-11-12 NOTE — Progress Notes (Signed)
Patient in physical therapy

## 2021-11-12 NOTE — Telephone Encounter (Signed)
Notified pt that rx was sent.

## 2021-11-12 NOTE — Progress Notes (Signed)
Patient back in room from PT.

## 2021-11-15 ENCOUNTER — Telehealth: Payer: Self-pay

## 2021-11-15 DIAGNOSIS — R29898 Other symptoms and signs involving the musculoskeletal system: Secondary | ICD-10-CM

## 2021-11-15 DIAGNOSIS — M5416 Radiculopathy, lumbar region: Secondary | ICD-10-CM

## 2021-11-15 NOTE — Telephone Encounter (Signed)
I received notice from Meg with [facility] that they are not in network with this pt's insurance.   Per Meg, the following 3 companies should be in network:  [facility], [address] [place]  [place], [place] [address] ([place]  [phone]     [facility]  [address]  [place], [place] [address] ([place]  [phone]     [facility]  [address] [place], [place] [address] ([place]  [phone]     I spoke with the patient about this, and she would like a referral to one of the above. She did not have a preference and we are not familiar with any of these. I explained that we will send a referral to the first one on the list and if they are not accepting new referrals, we will go down the list. She was agreeable to this plan.

## 2021-11-16 NOTE — Telephone Encounter (Signed)
Referral faxed to [facility].

## 2021-11-25 ENCOUNTER — Ambulatory Visit (INDEPENDENT_AMBULATORY_CARE_PROVIDER_SITE_OTHER): Payer: Medicaid Other | Admitting: Neurosurgery

## 2021-11-25 ENCOUNTER — Encounter: Payer: Self-pay | Admitting: Neurosurgery

## 2021-11-25 VITALS — BP 140/84 | HR 93 | Temp 98.4°F

## 2021-11-25 DIAGNOSIS — Z9889 Other specified postprocedural states: Secondary | ICD-10-CM

## 2021-11-25 DIAGNOSIS — M5416 Radiculopathy, lumbar region: Secondary | ICD-10-CM

## 2021-11-25 DIAGNOSIS — Z09 Encounter for follow-up examination after completed treatment for conditions other than malignant neoplasm: Secondary | ICD-10-CM

## 2021-11-25 MED ORDER — OXYCODONE-ACETAMINOPHEN 10-325 MG PO TABS: 1.0000 | ORAL_TABLET | Freq: Four times a day (QID) | ORAL | 0 refills | Status: AC | PRN

## 2021-11-25 NOTE — Telephone Encounter (Addendum)
Ms Melissa Black came in for an appt today and states she has not heard from [facility]. I spoke with [facility] and they informed me they cannot take her due to staffing.   We will refer the pt to outpatient PT. She is aware of this.

## 2021-11-25 NOTE — Progress Notes (Signed)
   REFERRING PHYSICIAN:  Venetia Night, Md [address] [place],  [place] [address]  DOS: 11/10/21 right redo L5-S1 microdiscectomy   HISTORY OF PRESENT ILLNESS: Melissa Black is approximately 2 weeks status post microdiscectomy. she is doing well. She reports improvement of her leg pain but does continue to have soreness in her back. She was supposed to have home health at discharge but no one has contacted her about this yet. Overall she is better than she was pre-op. She denies any incisional concerns.   PHYSICAL EXAMINATION:  General: Patient is well developed, well nourished, calm, collected, and in no apparent distress.   NEUROLOGICAL:  General: In no acute distress.   Awake, alert, oriented to person, place, and time.  Pupils equal round and reactive to light.  Facial tone is symmetric.  Tongue protrusion is midline.  There is no pronator drift.   Strength:            Side Iliopsoas Quads Hamstring PF DF EHL  R 5 5 5 5 5 5   L 5 5 5 5 5 5    Incision c/d/I and healing well  ROS (Neurologic):  Negative except as noted above  IMAGING: No interval imaging to review  ASSESSMENT/PLAN:  [name] is doing well approximately 2 weeks after redo microdiscectomy. She is doing well despite some soreness. Unfortunately the [facility] agency she was referred to is short staffed. We have sent a referral to [facility] for outpatient PT. We discussed activity escalation and I have advised the patient to lift up to 10 pounds until 6 weeks after surgery, then increase up to 25 pounds until 12 weeks after surgery.  After 12 weeks post-op, the patient advised to increase activity as tolerated.  she will follow up with Dr. [facility] in 4 weeks or sooner should she have any questions or concerns.   [facility] PA-C Department of neurosurgery

## 2021-11-29 ENCOUNTER — Telehealth: Payer: Self-pay

## 2021-11-29 NOTE — Telephone Encounter (Signed)
-----   Message from Rockey Situ sent at 11/29/2021 10:01 AM EDT ----- Regarding: note Contact: [phone] post right redo L5-S1 microdiscectomy on 11/10/21  Patient is requesting a letter from Dr.Yarbrough stating exactly what he told her family as why she had to have a 2nd surgery. Dr.Yarbrough told her family that her 2nd surgery was because he did not do some thing right the first time. That detail needs to be in the letter. This letter is for herself and to apply for disability benefits. They already requested her medical records. Call her when the letter is ready to be picked up.

## 2021-11-30 NOTE — Telephone Encounter (Signed)
Patient notified that Dr.Yarbrough will discuss a this with her at her next appt.

## 2021-12-23 ENCOUNTER — Ambulatory Visit (INDEPENDENT_AMBULATORY_CARE_PROVIDER_SITE_OTHER): Payer: Medicaid Other | Admitting: Neurosurgery

## 2021-12-23 ENCOUNTER — Encounter: Payer: Self-pay | Admitting: Neurosurgery

## 2021-12-23 VITALS — BP 118/78 | Temp 98.7°F | Ht 67.0 in | Wt 198.0 lb

## 2021-12-23 DIAGNOSIS — M5416 Radiculopathy, lumbar region: Secondary | ICD-10-CM

## 2021-12-23 DIAGNOSIS — Z09 Encounter for follow-up examination after completed treatment for conditions other than malignant neoplasm: Secondary | ICD-10-CM

## 2021-12-23 NOTE — Progress Notes (Signed)
   REFERRING PHYSICIAN:  Venetia Night, Md [address] [place],  [place] [address]  DOS: 11/10/21 right redo L5-S1 microdiscectomy   HISTORY OF PRESENT ILLNESS: Melissa Black is status post microdiscectomy.  Much better.  She still has some discomfort in her right lower leg, but she is much improved compared to prior to surgery.   PHYSICAL EXAMINATION:  General: Patient is well developed, well nourished, calm, collected, and in no apparent distress.   NEUROLOGICAL:  General: In no acute distress.   Awake, alert, oriented to person, place, and time.  Pupils equal round and reactive to light.  Facial tone is symmetric.  Tongue protrusion is midline.  There is no pronator drift.   Strength:            Side Iliopsoas Quads Hamstring PF DF EHL  R 5 5 5 5 5 5   L 5 5 5 5 5 5    Incision c/d/I and healing well  ROS (Neurologic):  Negative except as noted above  IMAGING: No interval imaging to review  ASSESSMENT/PLAN:  Melissa Black is doing well after redo microdiscectomy.  We will start her with some outpatient physical therapy.  I will see her back in 6 to 8 weeks.  We reviewed her activity limitations.    MD Department of neurosurgery

## 2021-12-24 ENCOUNTER — Telehealth: Payer: Self-pay

## 2021-12-24 DIAGNOSIS — M5416 Radiculopathy, lumbar region: Secondary | ICD-10-CM

## 2021-12-24 DIAGNOSIS — Z9889 Other specified postprocedural states: Secondary | ICD-10-CM

## 2021-12-24 DIAGNOSIS — R29898 Other symptoms and signs involving the musculoskeletal system: Secondary | ICD-10-CM

## 2021-12-24 MED ORDER — OXYCODONE-ACETAMINOPHEN 10-325 MG PO TABS: 1.0000 | ORAL_TABLET | Freq: Two times a day (BID) | ORAL | 0 refills | Status: AC | PRN

## 2021-12-24 NOTE — Telephone Encounter (Signed)
-----   Message from Rockey Situ sent at 12/24/2021 10:11 AM EDT ----- Regarding: pain med refill Contact: [phone] 11/10/21 right redo L5-S1 microdiscectomy  Oxycodone 5mg  1 or 2 aday depending on the pain, she might go aday or 2 without having to take it. [facility] 

## 2021-12-24 NOTE — Telephone Encounter (Signed)
11/10/21 right redo L5-S1 microdiscectomy   PMP reviewed and she received percocet 10 from PCP on 12/16/21 (#20, 5 day supply).  Reviewed with Dr. Myer Haff. Okay to give 2 week supply and then if she continues to need further pain medication she will need to get from PCP.   Percocet script sent to pharmacy. Please let her know.

## 2021-12-24 NOTE — Telephone Encounter (Signed)
Patient notified

## 2021-12-24 NOTE — Telephone Encounter (Signed)
Authorization for Oxycodone has been submitted to [employer] via fax. This is currently pending.

## 2021-12-24 NOTE — Telephone Encounter (Signed)
Mailbox full unable to leave message.

## 2021-12-28 NOTE — Telephone Encounter (Signed)
Oxycodone has been approved. Valid from 12/24/21 to 01/23/22.

## 2021-12-31 NOTE — Telephone Encounter (Signed)
[facility] received text from [facility] at 11:44 am that the doctor's office has not responded to the approval. She has been able to pick up oxycodone since last week. Can you call [facility] her and then call her. Her phone number [phone]

## 2021-12-31 NOTE — Telephone Encounter (Signed)
I spoke with the pharmacy and this is showing approved in their system. I have also called the patient and notified her of this and that she can pick this up.

## 2022-02-03 ENCOUNTER — Encounter: Payer: Medicaid Other | Admitting: Neurosurgery

## 2022-02-09 ENCOUNTER — Telehealth: Payer: Self-pay

## 2022-02-09 ENCOUNTER — Other Ambulatory Visit: Payer: Self-pay | Admitting: Neurosurgery

## 2022-02-09 MED ORDER — OXYCODONE-ACETAMINOPHEN 5-325 MG PO TABS: 1.0000 | ORAL_TABLET | Freq: Four times a day (QID) | ORAL | 0 refills | Status: AC | PRN

## 2022-02-09 NOTE — Telephone Encounter (Signed)
Patient notified of medication refill and changes, she agreed.

## 2022-02-09 NOTE — Telephone Encounter (Signed)
-----   Message from Peggyann Shoals sent at 02/09/2022  8:08 AM EDT ----- Regarding: pain med refill Contact: [phone]  right redo L5-S1 microdiscectomy on 11/10/21 Percocet [facility] She r/s to 10/24

## 2022-02-15 ENCOUNTER — Ambulatory Visit (INDEPENDENT_AMBULATORY_CARE_PROVIDER_SITE_OTHER): Payer: Medicaid Other | Admitting: Neurosurgery

## 2022-02-15 DIAGNOSIS — Z91199 Patient's noncompliance with other medical treatment and regimen due to unspecified reason: Secondary | ICD-10-CM

## 2022-02-16 ENCOUNTER — Telehealth: Payer: Self-pay

## 2022-02-16 NOTE — Telephone Encounter (Signed)
Noted  

## 2022-02-16 NOTE — Progress Notes (Signed)
Patient did not show for appointment.   

## 2022-02-16 NOTE — Telephone Encounter (Signed)
Patient did not show for post op appointment on 02/03/22. She was rescheduled to 02/15/22 and confirmed this appointment on 02/14/22. She also did not show on 02/15/22.  Per Dr Izora Ribas, if she calls to reschedule, she can see Stacy. We will not refill her pain meds any longer.

## 2022-02-24 NOTE — Progress Notes (Signed)
No show

## 2022-05-10 ENCOUNTER — Encounter: Payer: Self-pay | Admitting: Neurosurgery

## 2022-05-10 ENCOUNTER — Ambulatory Visit (INDEPENDENT_AMBULATORY_CARE_PROVIDER_SITE_OTHER): Payer: Medicaid Other | Admitting: Neurosurgery

## 2022-05-10 VITALS — BP 124/84 | Ht 67.0 in | Wt 198.0 lb

## 2022-05-10 DIAGNOSIS — G8929 Other chronic pain: Secondary | ICD-10-CM | POA: Diagnosis not present

## 2022-05-10 DIAGNOSIS — M545 Low back pain, unspecified: Secondary | ICD-10-CM

## 2022-05-10 DIAGNOSIS — Z9889 Other specified postprocedural states: Secondary | ICD-10-CM

## 2022-05-10 MED ORDER — CYCLOBENZAPRINE HCL 5 MG PO TABS
5.0000 mg | ORAL_TABLET | Freq: Two times a day (BID) | ORAL | 6 refills | Status: DC | PRN
Start: 2022-05-10 — End: 2023-11-24

## 2022-05-10 NOTE — Progress Notes (Signed)
   REFERRING PHYSICIAN:  No referring provider defined for this encounter.  DOS: 11/10/21 right redo L5-S1 microdiscectomy   05/10/22 HPI:  [name] presents today with persistent low back pain despite microdiscectomy in.  She continues to have numbness in her right leg.  Largely she states that she is unchanged from postop and feels like she is significantly improved from preop despite her residual symptoms.  12/23/21:  Melissa Black is status post microdiscectomy.  Much better.  She still has some discomfort in her right lower leg, but she is much improved compared to prior to surgery.   PHYSICAL EXAMINATION:  General: Patient is well developed, well nourished, calm, collected, and in no apparent distress.   NEUROLOGICAL:  General: In no acute distress.   Awake, alert, oriented to person, place, and time.  Pupils equal round and reactive to light.  Facial tone is symmetric.  Tongue protrusion is midline.  There is no pronator drift.   Strength:            Side Iliopsoas Quads Hamstring PF DF EHL  R 5 5 5 5 5 5   L 5 5 5 5 5 5    Incision c/d/I and healing well  ROS (Neurologic):  Negative except as noted above  IMAGING: No interval imaging to review  ASSESSMENT/PLAN:  Melissa Black is a 42 y.o presenting today about 6 months out from redo lumbar discectomy.  She continues to have back pain and numbness in her right leg but overall is improved from preop.  She has requested a note for a Hoveround and would like a refill of Flexeril.  We discussed that we can no longer provide controlled substances given that she is 3 months postoperative period.  We discussed the possibility of further workup with lumbar flexion-extension and MRI should her symptoms worsen however she still feels like she is improved from preop.  Should she decide she needs ongoing controlled substances, we are happy to place a referral to pain management for this.  She will let us know should she  decide to do this.  Will otherwise see her going forward on an as-needed basis.  She expressed understanding was in agreement with this plan.  I spent a total of 36 minutes in both face-to-face and non-face-to-face activities for this visit on the date of this encounter including review of records, question of symptoms, physical exam, discussion of plan of care, documentation, DME letter, and order placement.   Cooper Render PA-C Department of neurosurgery

## 2022-05-31 ENCOUNTER — Telehealth: Payer: Self-pay | Admitting: Neurosurgery

## 2022-05-31 NOTE — Telephone Encounter (Signed)
The patient and the medical supply store are aware that Melissa Black signed and sent all that was necessary from our behalf. If patient still does not qualify for a power chair she needs to contact her PCP for a regular wheelchair script.

## 2022-05-31 NOTE — Telephone Encounter (Signed)
-----   Message from Melissa Black, [place] sent at 05/31/2022  2:20 PM EST ----- Regarding: RE: paper work Contact: [phone] I filled out all of the paperwork I received and gave it to [place]. I have only received one set of paperwork ----- Message ----- From: Peggyann Shoals Sent: 05/31/2022   1:32 PM EST To: Melissa Dicker, PA Subject: paper work                                     Patient said that about 2 weeks ago paperwork for her powerchair was received by the medical supply store in [place] (pt not sure of the name). They sent another fax asking for additional information. They are telling the patient that they have contacted the office 3 times without a response. Do you still have the last fax from them? I remember the lady said she was faxing over a sheet with additional information that needed to be added to the office note.  Phone [phone] Medical Supply store in [place]

## 2022-08-02 ENCOUNTER — Other Ambulatory Visit: Payer: Self-pay | Admitting: Neurosurgery

## 2022-08-04 NOTE — Telephone Encounter (Signed)
Refill request for neurontin. This has not been filled since July 2023. Please call and find out if she is still taking this and if so, how often?

## 2022-08-04 NOTE — Telephone Encounter (Signed)
Please see below and call patient

## 2022-08-05 ENCOUNTER — Telehealth: Payer: Self-pay

## 2022-08-05 DIAGNOSIS — Z9889 Other specified postprocedural states: Secondary | ICD-10-CM

## 2022-08-05 DIAGNOSIS — G894 Chronic pain syndrome: Secondary | ICD-10-CM

## 2022-08-05 NOTE — Telephone Encounter (Signed)
Left a message for the patient to call back.  

## 2022-08-05 NOTE — Telephone Encounter (Signed)
Spoke with patient. She states her PCP has been prescribing her neurontin. Advised her to have pharmacy contact PCP.   Also told her that oxycodone was no longer recommended. She wants to be referred to pain management. This was done.

## 2022-08-05 NOTE — Telephone Encounter (Signed)
I spoke with the patient about needing a refill of her Gabapentin, but she also mentioned that she is in a lot of pain and would like a refill of the oxycodone that she has had in the past after surgery. She stated that at her last appt with Danielle she was going to refer her to a pain clinic but I dont see a referral for this, only for PT. She is also taking her Flexeril as prescribed. She know that the oxycodone does help her.

## 2022-08-05 NOTE — Telephone Encounter (Signed)
Patient states she has been taking neurontin 900mg  tid regularly. Last refill from [place] was in July of 2023.   Oxycodone is not recommended. I can do a referral to the pain clinic if she wants to look into this further. She can also make f/u with Duwayne Heck if needed.

## 2022-08-05 NOTE — Telephone Encounter (Signed)
I have attempted to call the patient and it rings couple of times then says "your call can't be completed as dialed"  I have sent her a MyChart message asking her about the prescription of gabapentin.

## 2022-08-31 ENCOUNTER — Ambulatory Visit: Payer: Medicaid Other | Admitting: Student in an Organized Health Care Education/Training Program

## 2022-12-08 DIAGNOSIS — R918 Other nonspecific abnormal finding of lung field: Secondary | ICD-10-CM | POA: Insufficient documentation

## 2023-02-21 ENCOUNTER — Other Ambulatory Visit: Payer: Self-pay | Admitting: Family Medicine

## 2023-02-21 DIAGNOSIS — Z1231 Encounter for screening mammogram for malignant neoplasm of breast: Secondary | ICD-10-CM

## 2023-02-22 ENCOUNTER — Other Ambulatory Visit: Payer: Self-pay | Admitting: Family Medicine

## 2023-02-22 DIAGNOSIS — E041 Nontoxic single thyroid nodule: Secondary | ICD-10-CM

## 2023-02-22 DIAGNOSIS — R911 Solitary pulmonary nodule: Secondary | ICD-10-CM

## 2023-03-01 ENCOUNTER — Ambulatory Visit
Admission: RE | Admit: 2023-03-01 | Discharge: 2023-03-01 | Disposition: A | Discharge status: Home / Self Care | Payer: Medicaid Other | Source: Ambulatory Visit | Attending: Family Medicine | Admitting: Family Medicine

## 2023-03-01 DIAGNOSIS — R911 Solitary pulmonary nodule: Secondary | ICD-10-CM | POA: Diagnosis present

## 2023-03-01 DIAGNOSIS — E041 Nontoxic single thyroid nodule: Secondary | ICD-10-CM | POA: Diagnosis present

## 2023-04-26 DIAGNOSIS — E041 Nontoxic single thyroid nodule: Secondary | ICD-10-CM | POA: Insufficient documentation

## 2023-07-17 DIAGNOSIS — R0789 Other chest pain: Secondary | ICD-10-CM | POA: Insufficient documentation

## 2023-07-19 ENCOUNTER — Encounter

## 2023-07-31 ENCOUNTER — Encounter: Payer: Self-pay | Admitting: Psychiatry

## 2023-07-31 ENCOUNTER — Ambulatory Visit (INDEPENDENT_AMBULATORY_CARE_PROVIDER_SITE_OTHER): Payer: Self-pay | Admitting: Psychiatry

## 2023-07-31 VITALS — BP 118/72 | HR 81 | Temp 98.6°F | Ht 67.0 in | Wt 194.8 lb

## 2023-07-31 DIAGNOSIS — F1290 Cannabis use, unspecified, uncomplicated: Secondary | ICD-10-CM

## 2023-07-31 DIAGNOSIS — R0683 Snoring: Secondary | ICD-10-CM | POA: Diagnosis not present

## 2023-07-31 DIAGNOSIS — F17200 Nicotine dependence, unspecified, uncomplicated: Secondary | ICD-10-CM

## 2023-07-31 DIAGNOSIS — F411 Generalized anxiety disorder: Secondary | ICD-10-CM | POA: Insufficient documentation

## 2023-07-31 DIAGNOSIS — F331 Major depressive disorder, recurrent, moderate: Secondary | ICD-10-CM | POA: Insufficient documentation

## 2023-07-31 MED ORDER — AMITRIPTYLINE HCL 25 MG PO TABS: 25.0000 mg | ORAL_TABLET | Freq: Every day | ORAL | 1 refills | Status: DC

## 2023-07-31 MED ORDER — ESCITALOPRAM OXALATE 5 MG PO TABS: 5.0000 mg | ORAL_TABLET | Freq: Every day | ORAL | 0 refills | Status: DC

## 2023-07-31 MED ORDER — ESCITALOPRAM OXALATE 20 MG PO TABS: 10.0000 mg | ORAL_TABLET | Freq: Every day | ORAL | Status: DC

## 2023-07-31 MED ORDER — HYDROXYZINE HCL 10 MG PO TABS: 10.0000 mg | ORAL_TABLET | Freq: Three times a day (TID) | ORAL | 0 refills | Status: DC | PRN

## 2023-07-31 NOTE — Patient Instructions (Addendum)
 [email or web address]  [email or web address]  [email or web address] [facility] [address], [place], [place] [address]   [phone]  [facility] [email or web address] [address], [place], [place] [address]  [phone]  [facility], [facility] [email or web address] [address] [place], [place] [address]  [phone]   [facility] - [phone]  [facility] - [phone]  [facility] - [phone] counseling [phone]  [facility] [phone]   [email or web address] this clinician can offer telehealth and has a sliding scale option  [email or web address] this group also offers sliding scale rates and is based out of [place]  Dr. Liborio Nixon with the [facility] specializes in divorce  [employer] [facility] has interns who offer sliding scale rates and some of the full time clinicians do, as well. You complete their contact form on their website and the referrals coordinator will help to get connected to someone   [email or web address] [phone]  Medicaid below :  [facility] [address] [place], [place] [address]  [phone]    [employer] [address] [place], [place] [address]  [phone]    [facility] [address] [place], [place] [address]  [phone]    Amitriptyline Tablets What is this medication? AMITRIPTYLINE (a mee TRIP ti leen) treats depression. It increases the amount of serotonin and norepinephrine in the brain, hormones that help regulate mood. It belongs to a group of medications called tricyclic antidepressants (TCAs). This medicine may be used for other purposes; ask your health care provider  or pharmacist if you have questions. COMMON BRAND NAME(S): Elavil, Vanatrip What should I tell my care team before I take this medication? They need to know if you have any of these conditions: Asthma, trouble breathing Bipolar disorder or schizophrenia Difficulty passing urine, prostate trouble Frequently drink alcohol Glaucoma Heart disease or previous heart attack Liver disease Seizures Suicidal thoughts, plans, or attempt by you or a family member Thyroid disease An unusual or allergic reaction to amitriptyline, other medications, foods, dyes, or preservatives Pregnant or trying to get pregnant Breastfeeding How should I use this medication? Take this medication by mouth with a drink of water. Follow the directions on the prescription label. You can take the tablets with or without food. Take your medication at regular intervals. Do not take it more often than directed. Do not stop taking this medication suddenly except upon the advice of your care team. Stopping this medication too quickly may cause serious side effects or your condition may worsen. A special MedGuide will be given to you by the pharmacist with each prescription and refill. Be sure to read this information carefully each time. Talk to your care team regarding the use of this medication in children. Special care may be needed. Overdosage: If you think you have taken too much of this medicine contact a poison control center or emergency room at once. NOTE: This medicine is only for you. Do not share this medicine with others. What if I miss a dose? If you miss a dose, take it as soon as you can. If it is almost time for your next dose, take only that dose. Do not take double or extra doses. What may interact with this medication? Do not take this medication with any of the following: Arsenic trioxide Certain medications used to  regulate abnormal heartbeat or to treat other heart  conditions Cisapride Droperidol Halofantrine Linezolid MAOIs like Carbex, Eldepryl, Marplan, Nardil, and Parnate Methylene blue Other medications for mental depression Phenothiazines like perphenazine, thioridazine and chlorpromazine Pimozide Probucol Procarbazine Sparfloxacin St. John's Wort This medication may also interact with the following: Atropine and related medications like hyoscyamine, scopolamine, tolterodine and others Barbiturate medications for inducing sleep or treating seizures, like phenobarbital Cimetidine Disulfiram Ethchlorvynol Thyroid hormones such as levothyroxine Ziprasidone This list may not describe all possible interactions. Give your health care provider a list of all the medicines, herbs, non-prescription drugs, or dietary supplements you use. Also tell them if you smoke, drink alcohol, or use illegal drugs. Some items may interact with your medicine. What should I watch for while using this medication? Visit your care team for regular checks on your progress. It may take several weeks to see the full effects of this medication, and it is important to continue your treatment as prescribed by your care team. Tell your care team if your symptoms do not get better or if they get worse. Patients and their families should watch out for new or worsening thoughts of suicide or depression. Also watch out for sudden changes in feelings such as feeling anxious, agitated, panicky, irritable, hostile, aggressive, impulsive, severely restless, overly excited and hyperactive, or not being able to sleep. If this happens, especially at the beginning of treatment or after a change in dose, call your care team. This medication may affect your coordination, reaction time, or judgment. Do not drive or operate machinery until you know how this medication affects you. Sit up or stand slowly to reduce the risk of dizzy or fainting spells. Drinking alcohol with this medication can  increase the risk of these side effects. Do not treat yourself for coughs, colds, or allergies while you are taking this medication without asking your care team for advice. Some ingredients can increase possible side effects. Your mouth may get dry. Chewing sugarless gum or sucking hard candy and drinking plenty of water may help. Contact your care team if the problem does not go away or is severe. This medication may cause dry eyes and blurred vision. If you wear contact lenses, you may feel some discomfort. Lubricating eye drops may help. See your care team if the problem does not go away or is severe. This medication can cause constipation. If you do not have a bowel movement for 3 days, call your care team. This medication can make you more sensitive to the sun. Keep out of the sun. If you cannot avoid being in the sun, wear protective clothing and sunscreen. Do not use sun lamps, tanning beds, or tanning booths. What side effects may I notice from receiving this medication? Side effects that you should report to your care team as soon as possible: Allergic reactions--skin rash, itching, hives, swelling of the face, lips, tongue, or throat Heart rhythm changes-- fast or irregular heartbeat, dizziness, feeling faint or lightheaded, chest pain, trouble breathing Serotonin syndrome--irritability, confusion, fast or irregular heartbeat, muscle stiffness, twitching muscles, sweating, high fever, seizure, chills, vomiting, diarrhea Sudden eye pain or change in vision such as blurry vision, seeing halos around lights, vision loss Thoughts of suicide or self-harm, worsening mood, feelings of depression Side effects that usually do not require medical attention (report to your care team if they continue or are bothersome): Change in appetite or weight Change in sex drive or performance Constipation Dizziness Drowsiness Dry mouth Tremors This list may  not describe all possible side effects. Call your  doctor for medical advice about side effects. You may report side effects to FDA at 1-800-FDA-1088. Where should I keep my medication? Keep out of the reach of children and pets. Store at room temperature between 20 and 25 degrees C (68 and 77 degrees F). Throw away any unused medication after the expiration date. NOTE: This sheet is a summary. It may not cover all possible information. If you have questions about this medicine, talk to your doctor, pharmacist, or health care provider.  2024 Elsevier/Gold Standard (2021-12-23 00:00:00)Hydroxyzine Capsules or Tablets What is this medication? HYDROXYZINE (hye DROX i zeen) treats the symptoms of allergies and allergic reactions. It may also be used to treat anxiety or cause drowsiness before a procedure. It works by blocking histamine, a substance released by the body during an allergic reaction. It belongs to a group of medications called antihistamines. This medicine may be used for other purposes; ask your health care provider or pharmacist if you have questions. COMMON BRAND NAME(S): ANX, Atarax, Rezine, Vistaril What should I tell my care team before I take this medication? They need to know if you have any of these conditions: Glaucoma Heart disease Irregular heartbeat or rhythm Kidney disease Liver disease Lung or breathing disease, such as asthma Stomach or intestine problems Thyroid disease Trouble passing urine An unusual or allergic reaction to hydroxyzine, other medications, foods, dyes or preservatives Pregnant or trying to get pregnant Breastfeeding How should I use this medication? Take this medication by mouth with a full glass of water. Take it as directed on the prescription label at the same time every day. You can take it with or without food. If it upsets your stomach, take it with food. Talk to your care team about the use of this medication in children. While it may be prescribed for children as young as 6 years for  selected conditions, precautions do apply. People 65 years and older may have a stronger reaction and need a smaller dose. Overdosage: If you think you have taken too much of this medicine contact a poison control center or emergency room at once. NOTE: This medicine is only for you. Do not share this medicine with others. What if I miss a dose? If you miss a dose, take it as soon as you can. If it is almost time for your next dose, take only that dose. Do not take double or extra doses. What may interact with this medication? Do not take this medication with any of the following: Cisapride Dronedarone Pimozide Thioridazine This medication may also interact with the following: Alcohol Antihistamines for allergy, cough, and cold Atropine Barbiturate medications for sleep or seizures, such as phenobarbital Certain antibiotics, such as erythromycin or clarithromycin Certain medications for anxiety or sleep Certain medications for bladder problems, such as oxybutynin or tolterodine Certain medications for irregular heartbeat Certain medications for mental health conditions Certain medications for Parkinson disease, such as benztropine, trihexyphenidyl Certain medications for seizures, such as phenobarbital or primidone Certain medications for stomach problems, such as dicyclomine or hyoscyamine Certain medications for travel sickness, such as scopolamine Ipratropium Opioid medications for pain Other medications that cause heart rhythm changes, such as dofetilide This list may not describe all possible interactions. Give your health care provider a list of all the medicines, herbs, non-prescription drugs, or dietary supplements you use. Also tell them if you smoke, drink alcohol, or use illegal drugs. Some items may interact with your medicine. What should  I watch for while using this medication? Visit your care team for regular checks on your progress. Tell your care team if your symptoms  do not start to get better or if they get worse. This medication may affect your coordination, reaction time, or judgment. Do not drive or operate machinery until you know how this medication affects you. Sit up or stand slowly to reduce the risk of dizzy or fainting spells. Drinking alcohol with this medication can increase the risk of these side effects. Your mouth may get dry. Chewing sugarless gum or sucking hard candy and drinking plenty of water may help. Contact your care team if the problem does not go away or is severe. This medication may cause dry eyes and blurred vision. If you wear contact lenses, you may feel some discomfort. Lubricating eye drops may help. See your care team if the problem does not go away or is severe. If you are receiving skin tests for allergies, tell your care team you are taking this medication. What side effects may I notice from receiving this medication? Side effects that you should report to your care team as soon as possible: Allergic reactions--skin rash, itching, hives, swelling of the face, lips, tongue, or throat Heart rhythm changes--fast or irregular heartbeat, dizziness, feeling faint or lightheaded, chest pain, trouble breathing Side effects that usually do not require medical attention (report to your care team if they continue or are bothersome): Confusion Drowsiness Dry mouth Hallucinations Headache This list may not describe all possible side effects. Call your doctor for medical advice about side effects. You may report side effects to FDA at 1-800-FDA-1088. Where should I keep my medication? Keep out of the reach of children and pets. Store at room temperature between 15 and 30 degrees C (59 and 86 degrees F). Keep container tightly closed. Throw away any unused medication after the expiration date. NOTE: This sheet is a summary. It may not cover all possible information. If you have questions about this medicine, talk to your doctor,  pharmacist, or health care provider.  2024 Elsevier/Gold Standard (2021-11-19 00:00:00)
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
## 2023-08-02 ENCOUNTER — Encounter: Payer: Self-pay | Admitting: Psychiatry

## 2023-08-02 DIAGNOSIS — F1290 Cannabis use, unspecified, uncomplicated: Secondary | ICD-10-CM | POA: Insufficient documentation

## 2023-08-02 DIAGNOSIS — F17200 Nicotine dependence, unspecified, uncomplicated: Secondary | ICD-10-CM | POA: Insufficient documentation

## 2023-08-04 ENCOUNTER — Ambulatory Visit: Admitting: Sleep Medicine

## 2023-08-24 ENCOUNTER — Other Ambulatory Visit: Payer: Self-pay | Admitting: Psychiatry

## 2023-08-24 DIAGNOSIS — F331 Major depressive disorder, recurrent, moderate: Secondary | ICD-10-CM

## 2023-08-24 DIAGNOSIS — F411 Generalized anxiety disorder: Secondary | ICD-10-CM

## 2023-08-29 ENCOUNTER — Ambulatory Visit: Payer: Self-pay | Admitting: Psychiatry

## 2023-10-04 ENCOUNTER — Ambulatory Visit: Admitting: Psychiatry

## 2023-11-17 ENCOUNTER — Emergency Department (HOSPITAL_BASED_OUTPATIENT_CLINIC_OR_DEPARTMENT_OTHER)
Admission: EM | Admit: 2023-11-17 | Discharge: 2023-11-18 | Disposition: A | Discharge status: Home / Self Care | Attending: Emergency Medicine | Admitting: Emergency Medicine

## 2023-11-17 ENCOUNTER — Other Ambulatory Visit: Payer: Self-pay

## 2023-11-17 ENCOUNTER — Emergency Department (HOSPITAL_BASED_OUTPATIENT_CLINIC_OR_DEPARTMENT_OTHER)

## 2023-11-17 DIAGNOSIS — R1013 Epigastric pain: Secondary | ICD-10-CM | POA: Diagnosis not present

## 2023-11-17 DIAGNOSIS — R0602 Shortness of breath: Secondary | ICD-10-CM | POA: Insufficient documentation

## 2023-11-17 DIAGNOSIS — R000 Tachycardia, unspecified: Secondary | ICD-10-CM | POA: Insufficient documentation

## 2023-11-17 DIAGNOSIS — E876 Hypokalemia: Secondary | ICD-10-CM | POA: Insufficient documentation

## 2023-11-17 LAB — CBC WITH DIFFERENTIAL/PLATELET
Abs Immature Granulocytes: 0.03 K/uL (ref 0.00–0.07)
Basophils Absolute: 0.1 K/uL (ref 0.0–0.1)
Basophils Relative: 1 %
Eosinophils Absolute: 0.2 K/uL (ref 0.0–0.5)
Eosinophils Relative: 2 %
HCT: 38.1 % (ref 36.0–46.0)
Hemoglobin: 13 g/dL (ref 12.0–15.0)
Immature Granulocytes: 0 %
Lymphocytes Relative: 34 %
Lymphs Abs: 3.4 K/uL (ref 0.7–4.0)
MCH: 30.2 pg (ref 26.0–34.0)
MCHC: 34.1 g/dL (ref 30.0–36.0)
MCV: 88.4 fL (ref 80.0–100.0)
Monocytes Absolute: 1.1 K/uL — ABNORMAL HIGH (ref 0.1–1.0)
Monocytes Relative: 11 %
Neutro Abs: 5.3 K/uL (ref 1.7–7.7)
Neutrophils Relative %: 52 %
Platelets: 284 K/uL (ref 150–400)
RBC: 4.31 MIL/uL (ref 3.87–5.11)
RDW: 12.3 % (ref 11.5–15.5)
WBC: 10.1 K/uL (ref 4.0–10.5)
nRBC: 0 % (ref 0.0–0.2)

## 2023-11-17 LAB — TROPONIN T, HIGH SENSITIVITY
Troponin T High Sensitivity: <15 ng/L (ref <19)
Troponin T High Sensitivity: <15 ng/L (ref <19)

## 2023-11-17 LAB — COMPREHENSIVE METABOLIC PANEL WITH GFR
ALT: 15 U/L (ref 0–44)
AST: 14 U/L — ABNORMAL LOW (ref 15–41)
Albumin: 3.6 g/dL (ref 3.5–5.0)
Alkaline Phosphatase: 47 U/L (ref 38–126)
Anion gap: 11 (ref 5–15)
BUN: 13 mg/dL (ref 6–20)
CO2: 18 mmol/L — ABNORMAL LOW (ref 22–32)
Calcium: 8.2 mg/dL — ABNORMAL LOW (ref 8.9–10.3)
Chloride: 111 mmol/L (ref 98–111)
Creatinine, Ser: 0.69 mg/dL (ref 0.44–1.00)
GFR, Estimated: >60 mL/min (ref >60)
Glucose, Bld: 97 mg/dL (ref 70–99)
Potassium: 3.4 mmol/L — ABNORMAL LOW (ref 3.5–5.1)
Sodium: 140 mmol/L (ref 135–145)
Total Bilirubin: <0.2 mg/dL (ref 0.0–1.2)
Total Protein: 6 g/dL — ABNORMAL LOW (ref 6.5–8.1)

## 2023-11-17 LAB — LIPASE, BLOOD: Lipase: 30 U/L (ref 11–51)

## 2023-11-17 MED ORDER — LORAZEPAM 2 MG/ML IJ SOLN
0.5000 mg | Freq: Once | INTRAMUSCULAR | Status: AC
  Administered 2023-11-17: 0.5 mg via INTRAVENOUS
  Filled 2023-11-17: qty 1

## 2023-11-17 NOTE — ED Provider Notes (Signed)
 Care assumed at 2300.  Patient here for evaluation of shortness of breath.  Care assumed pending repeat troponin.  Repeat troponin is unremarkable.  Will start on PPI for possible reflux component.  Discussed outpatient follow-up as well as return precautions.   Griselda Norris, MD 11/18/23 [phone]

## 2023-11-17 NOTE — ED Provider Notes (Addendum)
 [place] [facility] Provider Note   CSN: [id number] Arrival date & time: 11/17/23  2025     Patient presents with: Shortness of Breath   Melissa Black is a 43 y.o. adult.   Patient is a 43 year old female with a history of thyroid  nodules, anemia, shortness of breath who is presenting today due to complaint of shortness of breath and feeling like she cannot get enough air.  She reports this has been going on for several months now and she has followed up with her doctor who has done an x-ray but told her everything looked normal.  She reports she has never had a history of asthma and she was given an inhaler but it just makes her symptoms worse.  She feels short of breath almost all the time but is always worse when she is laying down and a little bit better when she is up moving around.  Tonight she was laying in bed eating an apple watching TV when she felt like it got stuck and she could not catch her breath.  She does note that she gets the symptoms a lot with eating and also has upper abdominal pain worse on the right side.  She does not have any vomiting, fever, cough.  She denies any tobacco use currently for years now and stopped vaping about a year ago.  She does take Depo every 3 months but is not on any estrogen therapies.  She has not had any unilateral leg pain or swelling.  No history of cardiac conditions in her or her family.  The history is provided by the patient.  Shortness of Breath      Prior to Admission medications   Medication Sig Start Date End Date Taking? Authorizing Provider  albuterol  (PROVENTIL  HFA;VENTOLIN  HFA) 108 (90 Base) MCG/ACT inhaler Inhale 1-2 puffs into the lungs every 6 (six) hours as needed for wheezing or shortness of breath.    [provider]  amitriptyline  (ELAVIL ) 25 MG tablet TAKE 1 TABLET (25 MG TOTAL) BY MOUTH AT BEDTIME. START TAKING AFTER YOU REDUCE ESCITALOPRAM  TO 5 MG DAILY 08/25/23    Eappen, Saramma, MD  cyclobenzaprine  (FLEXERIL ) 5 MG tablet Take 1 tablet (5 mg total) by mouth 2 (two) times daily as needed for muscle spasms. Patient not taking: Reported on 07/31/2023 05/10/22   Gregory Edsel Ruth, PA  escitalopram  (LEXAPRO ) 20 MG tablet Take 0.5 tablets (10 mg total) by mouth daily for 7 days. 07/31/23 08/07/23  Eappen, Saramma, MD  escitalopram  (LEXAPRO ) 5 MG tablet Take 1 tablet (5 mg total) by mouth daily for 7 days. Start taking once you stop Escitalopram  10 mg 08/06/23 08/13/23  Eappen, Saramma, MD  gabapentin  (NEURONTIN ) 600 MG tablet Take 1.5 tablets (900 mg total) by mouth 3 (three) times daily. 10/29/21 05/10/22  Clois Fret, MD  hydrOXYzine  (ATARAX ) 10 MG tablet TAKE 1 TABLET BY MOUTH 3 TIMES DAILY AS NEEDED FOR ANXIETY. 08/25/23   Eappen, Saramma, MD  medroxyPROGESTERone (DEPO-PROVERA) 150 MG/ML injection Inject into the muscle.    [provider]  methocarbamol  (ROBAXIN ) 500 MG tablet Take 500 mg by mouth 4 (four) times daily as needed. 03/30/23   [provider]  Multiple Vitamin (MULTIVITAMIN WITH MINERALS) TABS tablet Take 1 tablet by mouth in the morning. One A Day Multivitamin for Women    [provider]  naproxen (NAPROSYN) 500 MG tablet Take by mouth. 07/17/23   [provider]  Pediatric Multiple  Vitamins (FLINTSTONES PLUS EXTRA C) CHEW Chew by mouth.    [provider]  Varenicline Tartrate, Starter, 0.5 MG X 11 & 1 MG X 42 TBPK Take by mouth. 07/17/23   [provider]    Allergies: Codeine, Penicillins, Benadryl [diphenhydramine], and Bupropion    Review of Systems  Respiratory:  Positive for shortness of breath.     Updated Vital Signs BP 111/77   Pulse 75   Temp 98.1 F (36.7 C) (Oral)   Resp (!) 23   SpO2 100% Comment: Post ambulation to bathroom  Physical Exam Vitals and nursing note reviewed.  Constitutional:      General: She is in acute distress.     Appearance: She is well-developed.      Comments: Patient appears anxious and uncomfortable  HENT:     Head: Normocephalic and atraumatic.  Eyes:     Pupils: Pupils are equal, round, and reactive to light.  Neck:     Comments: No voice changes or trismus Cardiovascular:     Rate and Rhythm: Regular rhythm. Tachycardia present.     Heart sounds: Normal heart sounds. No murmur heard.    No friction rub.  Pulmonary:     Effort: Pulmonary effort is normal.     Breath sounds: Normal breath sounds. No wheezing or rales.  Abdominal:     General: Bowel sounds are normal. There is no distension.     Palpations: Abdomen is soft.     Tenderness: There is abdominal tenderness in the epigastric area. There is no guarding or rebound.  Musculoskeletal:        General: No tenderness. Normal range of motion.     Cervical back: Normal range of motion and neck supple.     Right lower leg: No edema.     Left lower leg: No edema.     Comments: No edema  Skin:    General: Skin is warm and dry.     Findings: No rash.  Neurological:     Mental Status: She is alert and oriented to person, place, and time. Mental status is at baseline.     Cranial Nerves: No cranial nerve deficit.  Psychiatric:        Behavior: Behavior normal.     (all labs ordered are listed, but only abnormal results are displayed) Labs Reviewed  CBC WITH DIFFERENTIAL/PLATELET - Abnormal; Notable for the following components:      Result Value   Monocytes Absolute 1.1 (*)    All other components within normal limits  LIPASE, BLOOD  COMPREHENSIVE METABOLIC PANEL WITH GFR  TROPONIN T, HIGH SENSITIVITY    EKG: EKG Interpretation Date/Time:  Friday November 17 2023 20:34:57 EDT Ventricular Rate:  84 PR Interval:  94 QRS Duration:  97 QT Interval:  347 QTC Calculation: 411 R Axis:   77  Text Interpretation: Sinus rhythm new Short PR interval Low voltage, precordial leads Confirmed by Doretha Folks ([id number]) on 11/17/2023 8:39:25 PM  Radiology: [facility] Chest Port 1  View Result Date: 11/17/2023 CLINICAL DATA:  Shortness of breath EXAM: PORTABLE CHEST 1 VIEW COMPARISON:  05/27/2016 FINDINGS: The heart size and mediastinal contours are within normal limits. Both lungs are clear. The visualized skeletal structures are unremarkable. IMPRESSION: No active disease. Electronically Signed   By: Luke Bun M.D.   On: 11/17/2023 21:24     Procedures   Medications Ordered in the ED  LORazepam  (ATIVAN ) injection 0.5 mg (0.5 mg Intravenous Given 11/17/23 2132)  Medical Decision Making Amount and/or Complexity of Data Reviewed Labs: ordered. Decision-making details documented in ED Course. Radiology: ordered and independent interpretation performed. Decision-making details documented in ED Course. ECG/medicine tests: ordered and independent interpretation performed. Decision-making details documented in ED Course.  Risk Prescription drug management.   Pt with multiple medical problems and comorbidities and presenting today with a complaint that caries a high risk for morbidity and mortality.  Here today with the above complaints.  Concern for dysrhythmia, ACS, GERD, pneumothorax, gastritis, cholecystitis, complications from thyroid  nodules, electrolyte abnormalities.  Patient's breath sounds are clear and sats are 100% on room air however she does appear very anxious.  She has no wheezing to suggest asthma.  Low suspicion for pneumonia as she has no infectious symptoms. I independently interpreted patient's labs and EKG.  Patient's EKG does show a new shortened PR interval but no evidence of dysrhythmia.  She is in a normal sinus rhythm without ST changes.  Lipase, CBC, troponin and CMP all without acute findings except for mild hypokalemia at 3.4.  I have independently visualized and interpreted pt's images today.  Chest x-ray within normal limits.  Delta troponin is pending.  If second troponin is normal would start a PPI for  possible GERD and patient needs follow-up with GI and her ENT.  Vital signs have been normal since she has been here.  No hypoxia noted.  She was able to walk to the bathroom without any shortness of breath.       Final diagnoses:  None    ED Discharge Orders     None          Doretha Folks, MD 11/17/23 [id number]    Doretha Folks, MD 11/17/23 2312

## 2023-11-17 NOTE — ED Triage Notes (Signed)
 Pt arrives via [facility] from home following concern for SOB progressing over the past week. Used her inhaler x3 prior to calling with little relief.  No resp distress, no wheezing noted through transport with EMS. Denies cough/fever/chills.   EMS VS 140/74, PR 100, 100RA, transported on 3 L [place] for comfort.

## 2023-11-18 MED ORDER — PANTOPRAZOLE SODIUM 40 MG PO TBEC: 40.0000 mg | DELAYED_RELEASE_TABLET | Freq: Every day | ORAL | 0 refills | Status: DC

## 2023-11-23 DIAGNOSIS — R1013 Epigastric pain: Secondary | ICD-10-CM | POA: Insufficient documentation

## 2023-11-24 ENCOUNTER — Other Ambulatory Visit: Payer: Self-pay

## 2023-11-24 ENCOUNTER — Emergency Department
Admission: EM | Admit: 2023-11-24 | Discharge: 2023-11-24 | Disposition: A | Discharge status: Home / Self Care | Attending: Emergency Medicine | Admitting: Emergency Medicine

## 2023-11-24 DIAGNOSIS — R079 Chest pain, unspecified: Secondary | ICD-10-CM | POA: Diagnosis not present

## 2023-11-24 DIAGNOSIS — R071 Chest pain on breathing: Secondary | ICD-10-CM

## 2023-11-24 DIAGNOSIS — E876 Hypokalemia: Secondary | ICD-10-CM | POA: Diagnosis not present

## 2023-11-24 DIAGNOSIS — F419 Anxiety disorder, unspecified: Secondary | ICD-10-CM | POA: Diagnosis present

## 2023-11-24 LAB — CBC WITH DIFFERENTIAL/PLATELET
Abs Immature Granulocytes: 0.03 K/uL (ref 0.00–0.07)
Basophils Absolute: 0.1 K/uL (ref 0.0–0.1)
Basophils Relative: 1 %
Eosinophils Absolute: 0.1 K/uL (ref 0.0–0.5)
Eosinophils Relative: 1 %
HCT: 36.8 % (ref 36.0–46.0)
Hemoglobin: 12.4 g/dL (ref 12.0–15.0)
Immature Granulocytes: 0 %
Lymphocytes Relative: 35 %
Lymphs Abs: 3.6 K/uL (ref 0.7–4.0)
MCH: 30.7 pg (ref 26.0–34.0)
MCHC: 33.7 g/dL (ref 30.0–36.0)
MCV: 91.1 fL (ref 80.0–100.0)
Monocytes Absolute: 1.2 K/uL — ABNORMAL HIGH (ref 0.1–1.0)
Monocytes Relative: 11 %
Neutro Abs: 5.2 K/uL (ref 1.7–7.7)
Neutrophils Relative %: 52 %
Platelets: 284 K/uL (ref 150–400)
RBC: 4.04 MIL/uL (ref 3.87–5.11)
RDW: 12.5 % (ref 11.5–15.5)
WBC: 10.1 K/uL (ref 4.0–10.5)
nRBC: 0 % (ref 0.0–0.2)

## 2023-11-24 LAB — BASIC METABOLIC PANEL WITH GFR
Anion gap: 8 (ref 5–15)
BUN: 15 mg/dL (ref 6–20)
CO2: 22 mmol/L (ref 22–32)
Calcium: 8.6 mg/dL — ABNORMAL LOW (ref 8.9–10.3)
Chloride: 110 mmol/L (ref 98–111)
Creatinine, Ser: 0.78 mg/dL (ref 0.44–1.00)
GFR, Estimated: >60 mL/min (ref >60)
Glucose, Bld: 100 mg/dL — ABNORMAL HIGH (ref 70–99)
Potassium: 3.1 mmol/L — ABNORMAL LOW (ref 3.5–5.1)
Sodium: 140 mmol/L (ref 135–145)

## 2023-11-24 LAB — TSH: TSH: 2.914 u[IU]/mL (ref 0.350–4.500)

## 2023-11-24 LAB — TROPONIN I (HIGH SENSITIVITY): Troponin I (High Sensitivity): 4 ng/L (ref <18)

## 2023-11-24 MED ORDER — POTASSIUM CHLORIDE CRYS ER 20 MEQ PO TBCR
20.0000 meq | EXTENDED_RELEASE_TABLET | Freq: Once | ORAL | Status: AC
  Administered 2023-11-24: 20 meq via ORAL
  Filled 2023-11-24: qty 1

## 2023-11-24 MED ORDER — CYCLOBENZAPRINE HCL 10 MG PO TABS
5.0000 mg | ORAL_TABLET | Freq: Once | ORAL | Status: AC
  Administered 2023-11-24: 5 mg via ORAL
  Filled 2023-11-24: qty 1

## 2023-11-24 MED ORDER — LORAZEPAM 1 MG PO TABS
1.0000 mg | ORAL_TABLET | Freq: Once | ORAL | Status: DC
  Filled 2023-11-24: qty 1

## 2023-11-24 MED ORDER — CYCLOBENZAPRINE HCL 10 MG PO TABS: 10.0000 mg | ORAL_TABLET | Freq: Three times a day (TID) | ORAL | 0 refills | Status: DC | PRN

## 2023-11-24 NOTE — ED Notes (Signed)
 Patient states chest pain resolved after taking aspirin.

## 2023-11-24 NOTE — ED Provider Triage Note (Addendum)
 Emergency Medicine Provider Triage Evaluation Note  Melissa Black , a 43 y.o. adult  was evaluated in triage.  Pt complains of chest pain.  Patient states symptoms started a week ago, having shortness of breath, chest pain. She was seen last night in his [place].  They did studies for chest pain and shortness of breath, they diagnosed her with anxiety and prescribed Atarax .  Patient states when she takes this medication she feels worse.  Patient has history of anxiety.  Patient denies numbness and tingling of hands or feet.  Patient states having steroids IV yesterday, they DC p.o. steroids.  Review of Systems  Positive:  Negative:  Physical Exam  There were no vitals taken for this visit. Gen:   Awake, no distress tachycardia Resp:  Normal effort no wheezing MSK:   Moves extremities without difficulty  Other:    Medical Decision Making  Medically screening exam initiated at 3:43 PM.  Appropriate orders placed.  AVO SCHLACHTER [name] was informed that the remainder of the evaluation will be completed by another provider, this initial triage assessment does not replace that evaluation, and the importance of remaining in the ED until their evaluation is complete.  Who presents today with symptoms of 1 week consistent in shortness of breath, chest pain.  Patient was diagnosed last night with anxiety.  Ordered EKG Janit Kast, PA-C 11/24/23 1546    Janit Kast, PA-C 11/24/23 1555

## 2023-11-24 NOTE — Discharge Instructions (Signed)
 You have been diagnosed with anxiety, shortness of breath.  Please take Flexeril  1 tablet by mouth every 8 hours after main meal, please do not drive while taking Flexeril . Please drink water with electrolytes (liquid IV) to prevent dehydration.  Please call your psychiatry doctor to make an appointment for a follow-up next week.  Please call endocrinology and make an appointment for a follow-up.  Please come back to ED or go to your PCP if you have any symptoms symptoms worsen.

## 2023-11-24 NOTE — ED Notes (Signed)
 Patient is requesting medications to help her breathe. Patient states she used her inhaler at home today which made her heart rate worse.

## 2023-11-24 NOTE — ED Triage Notes (Signed)
 Pt is very anxious while in triage. Pt sts that she is having SOB and CP. Pt sts that she has been at [facility] and [facility] over the past week dealing with this and they just send her home with anxiety medication.

## 2023-11-24 NOTE — ED Provider Notes (Signed)
 [facility] Provider Note    Event Date/Time   First MD Initiated Contact with Patient 11/24/23 1728     (approximate)   History   Anxiety    HPI  Melissa Black is a 43 y.o. adult    with a past medical history of hyperhidrosis, chest pain, dysphagia, MDD, thyroid  nodule, who presents to the ED complaining of chest pain. According to the patient, symptoms started a week and a half ago, chest pain, shortness of breath, heat intolerance, decreased appetite.  Patient has been seen 6 times in ED departments, [place], [place].  Per independent chart review, ED department was routed out cardiopulmonary pathologies, COVID, influenza.  Labs and imaging are normal.  Patient has been diagnosed with anxiety and discharged with Atarax .  Patient states having  pain in the epigastric area that increases with movement.  Patient is here with her daughter and her mother, patient is not driving tonight.     Patient Active Problem List   Diagnosis Date Noted   Long term current use of cannabis 08/02/2023   Nicotine use disorder 08/02/2023   MDD (major depressive disorder), recurrent episode, moderate (HCC) 07/31/2023   GAD (generalized anxiety disorder) 07/31/2023   Snoring 07/31/2023   Thyroid  nodule 04/26/2023   Lumbar radiculopathy 11/10/2021     ROS: Patient currently denies any vision changes, tinnitus, difficulty speaking, facial droop, sore throat, chest pain, shortness of breath, abdominal pain, nausea/vomiting/diarrhea, dysuria, or weakness/numbness/paresthesias in any extremity   Physical Exam   Triage Vital Signs: ED Triage Vitals  Encounter Vitals Group     BP 11/24/23 1542 110/65     Girls Systolic BP Percentile --      Girls Diastolic BP Percentile --      Boys Systolic BP Percentile --      Boys Diastolic BP Percentile --      Pulse Rate 11/24/23 1542 (!) 117     Resp 11/24/23 1542 (!) 27     Temp 11/24/23 1542 98 F (36.7 C)     Temp Source  11/24/23 1542 Oral     SpO2 11/24/23 1542 100 %     Weight 11/24/23 1545 190 lb (86.2 kg)     Height --      Head Circumference --      Peak Flow --      Pain Score 11/24/23 1545 10     Pain Loc --      Pain Education --      Exclude from Growth Chart --     Most recent vital signs: Vitals:   11/24/23 2023 11/24/23 2024  BP: 124/78   Pulse: 77 74  Resp: 18 14  Temp:    SpO2: 100% 100%     Physical Exam Vitals and nursing note reviewed.  During triage vital signs were normal  Constitutional:      General: Awake and alert. No acute distress.    Appearance: Normal appearance. The patient is normal weight.      Able to speak in complete sentences without cough or dyspnea  HENT:     Head: Normocephalic and atraumatic.     Mouth: Mucous membranes are moist.  Neck: Presence of thyroid  mass in the right side, about 4 cm x 3 cm, soft fixed. Eyes:     General: PERRL. Normal EOMs          Conjunctiva/sclera: Conjunctivae normal.  Nose No congestion/rhinorrhea  CV:  Good peripheral perfusion.  Regular rate and rhythm  Resp:               Normal effort.  Equal breath sounds bilaterally.  Abd:                 No distention.  Soft, nontender.  No rebound or guarding.  Musculoskeletal:        General: No swelling. Normal range of motion.  Tenderness to palpation in the epigastric area.  Skin:    General: Skin is warm and dry.     Capillary Refill: Capillary refill takes less than 2 seconds.     Findings: No rash.  Neurological:     Mental Status: The patient is awake and alert. MAE spontaneously. No gross focal neurologic deficits are appreciated.  Psychiatric Mood and affect are normal. Speech and behavior are normal.  ED Results / Procedures / Treatments   Labs (all labs ordered are listed, but only abnormal results are displayed) Labs Reviewed  CBC WITH DIFFERENTIAL/PLATELET - Abnormal; Notable for the following components:      Result Value   Monocytes  Absolute 1.2 (*)    All other components within normal limits  BASIC METABOLIC PANEL WITH GFR - Abnormal; Notable for the following components:   Potassium 3.1 (*)    Glucose, Bld 100 (*)    Calcium 8.6 (*)    All other components within normal limits  TSH  TROPONIN I (HIGH SENSITIVITY)  TROPONIN I (HIGH SENSITIVITY)     EKG See physician read    RADIOLOGY I independently reviewed and interpreted imaging and agree with radiologists findings.      PROCEDURES:  Critical Care performed:   Procedures   MEDICATIONS ORDERED IN ED: Medications  potassium chloride  SA (KLOR-CON  M) CR tablet 20 mEq (20 mEq Oral Given 11/24/23 2026)  cyclobenzaprine  (FLEXERIL ) tablet 5 mg (5 mg Oral Given 11/24/23 2051)   Clinical Course as of 11/24/23 2123  Fri Nov 24, 2023  2018 Basic metabolic panel(!) Hypokalemia of 3.1, hypocalcemia 8.6.  Sodium, renal function within normal limits.  I will replace potassium p.o. [AE]  2019 CBC with Differential(!) White blood cells, hemoglobin, platelets within normal limits [AE]  2019 Troponin I (High Sensitivity) Troponin within normal limits [AE]  2041 TSH Within  normal limits [AE]  2057 Reassessed the patient after taking Flexeril .  Patient states feeling more relaxed updated patient with results of labs.  Recommended patient to go to see psychiatry Dr. To manage anxiety.  Recommending to be seen by endocrinology for further studies.  Patient was ready for discharge.  Patient is agreeable with [AE]    Clinical Course User Index [AE] Janit Kast, PA-C    IMPRESSION / MDM / ASSESSMENT AND PLAN / ED COURSE  I reviewed the triage vital signs and the nursing notes.  Differential diagnosis includes, but is not limited to, hyperthyroidism, anxiety, ACS, hiatal hernia  Patient's presentation is most consistent with acute complicated illness / injury requiring diagnostic workup.   Melissa Black is a 43 y.o., adult presents today with history  of chest pain, shortness of breath, anxiety, heat intolerance, decreased appetite in the last week and a half.  Physical exam there is presence of right thyroid  nodule, soft, fixed, about 4 cmX 3 cm, tenderness to palpation in the epigastric areamuscular pain) Plan: Ordered TSH, chest x-ray EKG,  troponin, Flexeril .  Patient has hypokalemia ordered potassium p.o..  Patient's diagnosis is consistent with anxiety, shortness  of breath. Labs are  reassuring ruling out hyperthyroidism, ACS, MI. I did review the patient's allergies and medications.The patient is in stable and satisfactory condition for discharge home  Patient will be discharged home with prescriptions for Flexeril .  Advised patient not to drive while taking Flexeril , advised patient to drink water with electrolytes to prevent hypokalemia or dehydration . Patient is to follow up with psychiatry, endocrinology as needed or otherwise directed. Patient is given ED precautions to return to the ED for any worsening or new symptoms. Discussed plan of care with patient, answered all of patient's questions, Patient agreeable to plan of care. Advised patient to take medications according to the instructions on the label. Discussed possible side effects of new medications. Patient verbalized understanding.   FINAL CLINICAL IMPRESSION(S) / ED DIAGNOSES   Final diagnoses:  Anxiety  Chest pain on breathing     Rx / DC Orders   ED Discharge Orders          Ordered    cyclobenzaprine  (FLEXERIL ) 10 MG tablet  3 times daily PRN        11/24/23 2112             Note:  This document was prepared using Dragon voice recognition software and may include unintentional dictation errors.   Janit Kast, PA-C 11/24/23 2123    Willo Dunnings, MD 11/25/23 [phone]

## 2023-11-24 NOTE — ED Notes (Signed)
 Evans PA made aware that pt states Ativan  makes her panic attacks worse. Per Evans PA, hold Ativan .

## 2023-11-24 NOTE — ED Notes (Signed)
 Pt provided with orange juice, okay per Janit PA

## 2023-11-27 ENCOUNTER — Telehealth: Payer: Self-pay | Admitting: Neurosurgery

## 2023-11-27 ENCOUNTER — Ambulatory Visit (HOSPITAL_BASED_OUTPATIENT_CLINIC_OR_DEPARTMENT_OTHER): Admitting: Primary Care

## 2023-11-27 NOTE — Telephone Encounter (Signed)
Patient scheduled and added to the wait list.

## 2023-11-27 NOTE — Telephone Encounter (Signed)
 Patient called wanting to make an appointment.  She has been seen in the ED 4 times in the last 2 weeks with chest pain and shortness of breath.  She has had multiple tests and been seen by cardiology and pulmonology.  She stated her PCP told her that this could be due to the back surgery and she should call and make an appointment to be seen.  Please advise on when to schedule patient.

## 2023-11-27 NOTE — Telephone Encounter (Signed)
 Lumbar issues generally do not cause chest pain, but we are happy to see her for evaluation of her back. Can put on any PA schedule.

## 2023-11-30 ENCOUNTER — Ambulatory Visit (INDEPENDENT_AMBULATORY_CARE_PROVIDER_SITE_OTHER): Admitting: Pulmonary Disease

## 2023-11-30 ENCOUNTER — Encounter (HOSPITAL_BASED_OUTPATIENT_CLINIC_OR_DEPARTMENT_OTHER): Payer: Self-pay | Admitting: Pulmonary Disease

## 2023-11-30 VITALS — BP 114/79 | HR 90 | Ht 67.0 in | Wt 190.0 lb

## 2023-11-30 DIAGNOSIS — J432 Centrilobular emphysema: Secondary | ICD-10-CM

## 2023-11-30 DIAGNOSIS — R918 Other nonspecific abnormal finding of lung field: Secondary | ICD-10-CM

## 2023-11-30 DIAGNOSIS — R0609 Other forms of dyspnea: Secondary | ICD-10-CM

## 2023-11-30 DIAGNOSIS — E049 Nontoxic goiter, unspecified: Secondary | ICD-10-CM

## 2023-11-30 MED ORDER — SPIRIVA RESPIMAT 2.5 MCG/ACT IN AERS: 2.0000 | INHALATION_SPRAY | Freq: Every day | RESPIRATORY_TRACT | 5 refills | Status: DC

## 2023-11-30 MED ORDER — SPIRIVA RESPIMAT 2.5 MCG/ACT IN AERS: 2.0000 | INHALATION_SPRAY | Freq: Every day | RESPIRATORY_TRACT | Status: DC

## 2023-11-30 NOTE — Progress Notes (Signed)
 New Patient Pulmonology Office Visit   Subjective:  Patient ID: Melissa Black, adult    DOB: 12-Oct-1980  MRN: [id number]  Referred by: Eappen, Saramma, MD  CC:  Chief Complaint  Patient presents with   Establish Care    HPI Melissa Black is a 43 y.o. adult with MDD, hyperhidrosis who presents for initial consultation in the setting of dyspnea.  Shortness of breath started a few weeks ago. She went 4 times to the ED. She has sometimes can hardly get her breath. She has some anxiety related to breath. She has rest dyspnea, laying down, and on exertion. Her vitals are good. All her work up is all good. Gradually worsening and now not breathing very well. ED doc gave her flexeril  and it helps her not to panic. No coughing or wheezing. Discomfort in chest. Every once and a while there's chest pain and sometimes radiates to the back. Aspirin helps with pain. No chest tightness. Orthopnea. No leg swelling. Losing weight - now 190 lbs. When she eats, she is short of breath. 10 lbs unintentional weight loss over 3 weeks. Low appetite. No trouble with anemia. K is low. No clots in legs or lungs. No pregnancy. Period not heavy since on Depo. Albuterol  worsened the breathing. Trouble with swallowing. She used to have asthma years ago. She was 15 years and grew out of it. No prednisone  prescriptions. Steroids didn't help.  PSH: back surgery in 2023  Medications: reviewed in chart.  Allergies: benadryl, codeine, penicillin  Social Hx: - Smoking quit 2022, 20 years x 1 pack = 20 PY  - Second hand smoking none  - Vaping quit vaping a month ago, nicotine products  - Alcohol none  - Illicit substances none  - Indoor emission from household combustion: none  - Hobbies (e.g. wood work, Surveyor, quantity, bird breeding etc...): none - Environmental (e.g. 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 [ ]  Asbestos -  Holiday representative workers, Medical sales representative, Therapist, sports, railroad, Chartered certified accountant radiation - uranium mining [ ]  Vinyl chloride - pulp & paper workers  [ ]  Arsenic - smelting of ores, Chief Executive Officer, Technical sales engineer  [ ]  Beryllium - Archivist workers, Geophysical data processor, Pensions consultant workers, jewelers  [ ]  chromium - Financial trader, welding, tanning industries  [ ]  Nickel - Chief Strategy Officer, Naval architect, Physiological scientist, glass workers, Health and safety inspector, metal workers   Family Hx:  - Lung disease: maternal hx of COPD  - Cancer: none   ROS  Allergies: Codeine, Penicillins, Benadryl [diphenhydramine], and Bupropion  Current Outpatient Medications:    amitriptyline  (ELAVIL ) 25 MG tablet, TAKE 1 TABLET (25 MG TOTAL) BY MOUTH AT BEDTIME. START TAKING AFTER YOU REDUCE ESCITALOPRAM  TO 5 MG DAILY, Disp: 90 tablet, Rfl: 0   cyclobenzaprine  (FLEXERIL ) 10 MG tablet, Take 1 tablet (10 mg total) by mouth 3 (three) times daily as needed for up to 20 days., Disp: 60 tablet, Rfl: 0   gabapentin  (NEURONTIN ) 600 MG tablet, Take 1.5 tablets (900 mg total) by mouth 3 (three) times daily., Disp: 135 tablet, Rfl: 0   hydrOXYzine  (ATARAX ) 10 MG tablet, TAKE 1 TABLET BY MOUTH 3 TIMES DAILY AS NEEDED FOR ANXIETY., Disp: 270 tablet, Rfl: 0   medroxyPROGESTERone (DEPO-PROVERA) 150 MG/ML injection, Inject into the muscle., Disp: , Rfl:    Multiple Vitamin (MULTIVITAMIN WITH MINERALS) TABS tablet, Take 1 tablet by mouth in  the morning. One A Day Multivitamin for Women, Disp: , Rfl:    Tiotropium Bromide Monohydrate  (SPIRIVA  RESPIMAT) 2.5 MCG/ACT AERS, Inhale 2 puffs into the lungs daily., Disp: , Rfl:    Tiotropium Bromide Monohydrate  (SPIRIVA  RESPIMAT) 2.5 MCG/ACT AERS, Inhale 2 puffs into the lungs daily., Disp: 1 each, Rfl: 5 Past Medical History:  Diagnosis Date   ADHD (attention deficit hyperactivity disorder)    Anemia    h/o   Anxiety    Asthma    Chest pain, unspecified    Genital herpes     History of chlamydia    History of gonorrhea    History of marijuana use    Obesity    SOB (shortness of breath)    Spinal stenosis of lumbar region with radiculopathy    Past Surgical History:  Procedure Laterality Date   CESAREAN SECTION     x1   FOOT SURGERY Right    broken toe   HEMI-MICRODISCECTOMY LUMBAR LAMINECTOMY LEVEL 1 Right 11/10/2021   Procedure: OPEN RIGHT REDO L5-S1 MICRODISCECTOMY;  Surgeon: Clois Fret, MD;  Location: [facility] ORS;  Service: Neurosurgery;  Laterality: Right;   LUMBAR LAMINECTOMY/DECOMPRESSION MICRODISCECTOMY Right 08/30/2021   Procedure: RIGHT L4-5 LAMINOFORAMINOTOMY, RIGHT L5-S1 MICRODISCECTOMY;  Surgeon: Clois Fret, MD;  Location: [facility] ORS;  Service: Neurosurgery;  Laterality: Right;   Family History  Problem Relation Age of Onset   Depression Mother    ADD / ADHD Son    Social History   Socioeconomic History   Marital status: Divorced    Spouse name: Not on file   Number of children: 2   Years of education: Not on file   Highest education level: High school graduate  Occupational History   Not on file  Tobacco Use   Smoking status: Former    Current packs/day: 0.00    Average packs/day: 0.5 packs/day for 20.0 years (10.0 ttl pk-yrs)    Types: Cigarettes    Start date: 2002    Quit date: 2022    Years since quitting: 3.6   Smokeless tobacco: Never  Vaping Use   Vaping status: Every Day   Substances: Flavoring  Substance and Sexual Activity   Alcohol use: No   Drug use: Not Currently    Types: Marijuana   Sexual activity: Yes  Other Topics Concern   Not on file  Social History Narrative   Lives with boyfriend and daughter   Social Drivers of Corporate investment banker Strain: Not on file  Food Insecurity: Not on file  Transportation Needs: Not on file  Physical Activity: Not on file  Stress: Not on file  Social Connections: Not on file  Intimate Partner Violence: Not on file       Objective:  BP 114/79 (BP  Location: Left Arm, Patient Position: Sitting, Cuff Size: Normal)   Pulse 90   Ht 5' 7 (1.702 m)   Wt 190 lb (86.2 kg)   LMP 09/26/2023 (Exact Date)   SpO2 100%   BMI 29.76 kg/m  SpO2 Readings from Last 3 Encounters:  11/30/23 100%  11/24/23 100%  11/17/23 99%    Physical Exam General: NAD, alert, WD, WN Eyes: PERRL, no scleral icterus ENMT: oropharynx clear, good dentition, no oral lesions, mallampati score II Skin: warm, intact, no rashes Neck: large right sided goiter CV: RRR, no MRG, nl S1 and S2, no peripheral edema Resp: clear to auscultation bilaterally, no wheezes, rales, or rhonchi, normal effort, no clubbing/cyanosis Abdom: Normoactive bowel sounds,  soft, nontender, nondistended, no hepatosplenomegaly Neuro: Awake alert oriented to person place time and situation  Diagnostic Review:  Last CBC Lab Results  Component Value Date   WBC 10.1 11/24/2023   HGB 12.4 11/24/2023   HCT 36.8 11/24/2023   MCV 91.1 11/24/2023   MCH 30.7 11/24/2023   RDW 12.5 11/24/2023   PLT 284 11/24/2023    CXR on 11/17/2023 relatively normal CT Chest 03/01/2023 shows mild upper lobe emphysematous changes as well as scattered < 6 mm pulmonary nodules. 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    Assessment & Plan:   Assessment & Plan Dyspnea on exertion Likely multifactorial in etiology due to emphysema identified on CT chest as well as goiter with mass effect on trachea displacing it leftward.  I informed the patient that she should reach out to her endocrinologist or thyroid  doctor to discuss possible surgical removal of goiter since she has dyspnea and dysphagia which may be indications for treatment.  As far as treatment for emphysema, see below.  The patient has no anemia on her labs.  I ordered a D-dimer test to rule out clots. Goiter Advised patient to reach out to thyroid  doctor to discuss surgical removal of goiter. Multiple  nodules of lung CT chest obtained in December 2024 shows multiple subcentimeter pulmonary nodules.  Per Fleischner criteria would repeat CT chest in a year.  This will be due December 2025. Centrilobular emphysema (HCC) The patient has extensive smoking history of 20 pack years.  She has evidence of upper lobe emphysematous changes on CT chest.  We do not have formal PFTs to assess stage of COPD.  Her mMRC is above 2 and her CAT score is above 10.  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  I gave her a sample of Spiriva , LAMA, to try and see if that would change her symptoms.  I encouraged her to use albuterol  as needed whenever she is short of breath.  We taught patient how to use the inhaler in the clinic  Orders Placed This Encounter  Procedures   CT CHEST WO CONTRAST   D-Dimer, Quantitative   Pulmonary Function Test   I spent 61 minutes reviewing patient's chart including prior consultant notes, imaging, and PFTs as well as face-to-face with the patient, over half in discussion of the diagnosis and the importance of compliance with the treatment plan.  Return in about 8 weeks (around 01/25/2024).   Annabelle Rexroad, MD

## 2023-11-30 NOTE — Patient Instructions (Addendum)
-   Breathing test - Blood test to rule out clots - I'll start an inhaler - Talk to thyroid  doctor to remove thyroid  since its compressing on your windpipe - CT chest in 03/2024

## 2023-12-01 ENCOUNTER — Ambulatory Visit: Payer: Self-pay | Admitting: Pulmonary Disease

## 2023-12-01 LAB — D-DIMER, QUANTITATIVE: D-DIMER: 0.35 mg{FEU}/L (ref 0.00–0.49)

## 2023-12-03 ENCOUNTER — Other Ambulatory Visit: Payer: Self-pay | Admitting: Psychiatry

## 2023-12-03 DIAGNOSIS — F411 Generalized anxiety disorder: Secondary | ICD-10-CM

## 2023-12-03 DIAGNOSIS — F331 Major depressive disorder, recurrent, moderate: Secondary | ICD-10-CM

## 2023-12-04 ENCOUNTER — Ambulatory Visit (INDEPENDENT_AMBULATORY_CARE_PROVIDER_SITE_OTHER): Admitting: Internal Medicine

## 2023-12-04 DIAGNOSIS — R0609 Other forms of dyspnea: Secondary | ICD-10-CM | POA: Diagnosis not present

## 2023-12-04 DIAGNOSIS — J432 Centrilobular emphysema: Secondary | ICD-10-CM

## 2023-12-04 LAB — PULMONARY FUNCTION TEST
DL/VA % pred: 111 %
DL/VA: 4.75 ml/min/mmHg/L
DLCO unc % pred: 88 %
DLCO unc: 21.12 ml/min/mmHg
FEF 25-75 Pre: 2.57 L/s
FEF2575-%Pred-Pre: 80 %
FEV1-%Pred-Pre: 83 %
FEV1-Pre: 2.71 L
FEV1FVC-%Pred-Pre: 96 %
FEV6-%Pred-Pre: 87 %
FEV6-Pre: 3.44 L
FEV6FVC-%Pred-Pre: 101 %
FVC-%Pred-Pre: 85 %
FVC-Pre: 3.44 L
Pre FEV1/FVC ratio: 79 %
Pre FEV6/FVC Ratio: 100 %

## 2023-12-04 NOTE — Progress Notes (Signed)
 PFT completed without post per MD order. Pt was unable to do pleth due to having a panic attack when door shut. The results of this test meets ATS standards for acceptability and repeatability.

## 2023-12-04 NOTE — Patient Instructions (Signed)
Spiro/ DLCO completed today  

## 2023-12-05 ENCOUNTER — Encounter (HOSPITAL_BASED_OUTPATIENT_CLINIC_OR_DEPARTMENT_OTHER): Payer: Self-pay | Admitting: Pulmonary Disease

## 2023-12-05 ENCOUNTER — Encounter: Payer: Self-pay | Admitting: Internal Medicine

## 2023-12-05 DIAGNOSIS — R0609 Other forms of dyspnea: Secondary | ICD-10-CM

## 2023-12-06 NOTE — Telephone Encounter (Signed)
 I reviewed the patient's PFTs. They look relatively normal. Her D dimer test was negative for clotting. I don't believe her shortness of breath is due to her lung but happy to see her sooner if I have availability. Please inform patient of that.

## 2023-12-06 NOTE — Telephone Encounter (Signed)
 Called patient to inform of message from covering provider no answer left voicemail for patient to return call to office

## 2023-12-06 NOTE — Telephone Encounter (Signed)
 Patient had PFT on 12/04/2023.

## 2023-12-06 NOTE — Telephone Encounter (Signed)
 Duplicate encounter has been sent to Dr Catherine per Evalene

## 2023-12-06 NOTE — Telephone Encounter (Signed)
 Please contact the patient (This is Dr. Senora patient). Since she hasn't been seen since April, it's hard to say whether the medication is contributing to her irritability or if other factors might be at play. That said, if she feels that the medication, likely amitriptyline , is causing more harm than good, she may discontinue it at this time.

## 2023-12-06 NOTE — Telephone Encounter (Signed)
 Duplicate message has been sent to Dr Catherine

## 2023-12-07 ENCOUNTER — Other Ambulatory Visit: Payer: Self-pay | Admitting: Family Medicine

## 2023-12-07 DIAGNOSIS — Z1231 Encounter for screening mammogram for malignant neoplasm of breast: Secondary | ICD-10-CM

## 2023-12-07 NOTE — Telephone Encounter (Signed)
 I called patient and gave her warning signs to go to ED if symptoms are getting really uncontrollable. I did offer to see her in [facility] over the next two weeks. Can you squeeze her in for a sick visit? Thanks so much.

## 2023-12-07 NOTE — Telephone Encounter (Signed)
 Increased SOB. No wheezing or cough. No fevers, chills or sweats.  Sprayed her house with bug spray 7/24 and 7/25 and her SOB increased after that. She started having to go to the hospital on 7/25 for her breathing. She said everytime she goes to the ED they check her O2 and it is good,but she still can not breathing.   I did tell her we could not admit her to the hospital, and she would have to go to the ED if she feels she needs to be admitted.  Dr. Catherine please advise on if you can see her sooner then 9/30 or any other suggestions you may have.

## 2023-12-08 ENCOUNTER — Inpatient Hospital Stay (HOSPITAL_BASED_OUTPATIENT_CLINIC_OR_DEPARTMENT_OTHER): Admission: RE | Admit: 2023-12-08 | Source: Ambulatory Visit | Admitting: Radiology

## 2023-12-08 DIAGNOSIS — Z1231 Encounter for screening mammogram for malignant neoplasm of breast: Secondary | ICD-10-CM

## 2023-12-08 NOTE — Telephone Encounter (Signed)
 Noted. Nothing further needed.

## 2023-12-13 NOTE — Progress Notes (Deleted)
 Referring Physician:  [facility] [address] [place],  [place] [address]  Primary Physician:  [facility]  History of Present Illness: 12/13/2023 [name] is here today with a chief complaint of ***  Back pain Leg pain? Numbness?   Duration: *** Location: *** Quality: *** Severity: ***  Precipitating: aggravated by *** Modifying factors: made better by *** Weakness: none Timing: *** Bowel/Bladder Dysfunction: none  Conservative measures:  Physical therapy: *** has not participated in Multimodal medical therapy including regular antiinflammatories: *** Flexeril , Gabapentin  Injections:  07/15/21: right S1 TFESI by Dr Dodson (no relief) 03/08/21: right S1 TFESI by Dr Dodson (80% relief of back pain) 10/12/20: right S1 TFESI by Dr Dodson (50% improvement) 08/20/20: right S1 TFESI by Dr Dodson (significant relief of back pain but still with right leg pain)   Past Surgery:  08/30/2021- Right L5/S1 microdiscectomy and Right L4/5 laminoforaminotomy 11/10/2021- Redo L5-S1 microdiscectomy    Brad SAILOR Snipes Black has ***no symptoms of cervical myelopathy.  The symptoms are causing a significant impact on the patient's life.   Review of Systems:  A 10 point review of systems is negative, except for the pertinent positives and negatives detailed in the HPI.  Past Medical History: Past Medical History:  Diagnosis Date   ADHD (attention deficit hyperactivity disorder)    Anemia    h/o   Anxiety    Asthma    Chest pain, unspecified    Genital herpes    History of chlamydia    History of gonorrhea    History of marijuana use    Obesity    SOB (shortness of breath)    Spinal stenosis of lumbar region with radiculopathy     Past Surgical History: Past Surgical History:  Procedure Laterality Date   CESAREAN SECTION     x1   FOOT SURGERY Right    broken toe   HEMI-MICRODISCECTOMY LUMBAR  LAMINECTOMY LEVEL 1 Right 11/10/2021   Procedure: OPEN RIGHT REDO L5-S1 MICRODISCECTOMY;  Surgeon: Clois Fret, MD;  Location: [facility] ORS;  Service: Neurosurgery;  Laterality: Right;   LUMBAR LAMINECTOMY/DECOMPRESSION MICRODISCECTOMY Right 08/30/2021   Procedure: RIGHT L4-5 LAMINOFORAMINOTOMY, RIGHT L5-S1 MICRODISCECTOMY;  Surgeon: Clois Fret, MD;  Location: [facility] ORS;  Service: Neurosurgery;  Laterality: Right;    Allergies: Allergies as of 12/19/2023 - Review Complete 11/30/2023  Allergen Reaction Noted   Codeine Swelling 04/02/2015   Penicillins Swelling and Other (See Comments) 04/02/2015   Benadryl [diphenhydramine] Hives 09/04/2015   Bupropion Palpitations 02/06/2019    Medications: Outpatient Encounter Medications as of 12/19/2023  Medication Sig   amitriptyline  (ELAVIL ) 25 MG tablet Take 1 tablet (25 mg total) by mouth at bedtime.   cyclobenzaprine  (FLEXERIL ) 10 MG tablet Take 1 tablet (10 mg total) by mouth 3 (three) times daily as needed for up to 20 days.   gabapentin  (NEURONTIN ) 600 MG tablet Take 1.5 tablets (900 mg total) by mouth 3 (three) times daily.   hydrOXYzine  (ATARAX ) 10 MG tablet TAKE 1 TABLET BY MOUTH 3 TIMES DAILY AS NEEDED FOR ANXIETY.   medroxyPROGESTERone (DEPO-PROVERA) 150 MG/ML injection Inject into the muscle.   Multiple Vitamin (MULTIVITAMIN WITH MINERALS) TABS tablet Take 1 tablet by mouth in the morning. One A Day Multivitamin for Women   Tiotropium Bromide Monohydrate  (SPIRIVA  RESPIMAT) 2.5 MCG/ACT AERS Inhale 2 puffs into the lungs daily.   Tiotropium Bromide Monohydrate  (SPIRIVA  RESPIMAT) 2.5 MCG/ACT AERS Inhale 2 puffs into the lungs daily.  No facility-administered encounter medications on file as of 12/19/2023.    Social History: Social History   Tobacco Use   Smoking status: Former    Current packs/day: 0.00    Average packs/day: 0.5 packs/day for 20.0 years (10.0 ttl pk-yrs)    Types: Cigarettes    Start date: 2002    Quit date:  2022    Years since quitting: 3.6   Smokeless tobacco: Never  Vaping Use   Vaping status: Every Day   Substances: Flavoring  Substance Use Topics   Alcohol use: No   Drug use: Not Currently    Types: Marijuana    Family Medical History: Family History  Problem Relation Age of Onset   Depression Mother    ADD / ADHD Son     Physical Examination: @VITALWITHPAIN @  General: Patient is well developed, well nourished, calm, collected, and in no apparent distress. Attention to examination is appropriate.  Psychiatric: Patient is non-anxious.  Head:  Pupils equal, round, and reactive to light.  ENT:  Oral mucosa appears well hydrated.  Neck:   Supple.  ***Full range of motion.  Respiratory: Patient is breathing without any difficulty.  Extremities: No edema.  Vascular: Palpable dorsal pedal pulses.  Skin:   On exposed skin, there are no abnormal skin lesions.  NEUROLOGICAL:     Awake, alert, oriented to person, place, and time.  Speech is clear and fluent. Fund of knowledge is appropriate.   Cranial Nerves: Pupils equal round and reactive to light.  Facial tone is symmetric.  Facial sensation is symmetric.  ROM of spine: ***full.  Palpation of spine: ***non tender.    Strength: Side Biceps Triceps Deltoid Interossei Grip Wrist Ext. Wrist Flex.  R 5 5 5 5 5 5 5   L 5 5 5 5 5 5 5    Side Iliopsoas Quads Hamstring PF DF EHL  R 5 5 5 5 5 5   L 5 5 5 5 5 5    Reflexes are ***2+ and symmetric at the biceps, triceps, brachioradialis, patella and achilles.   Hoffman's is absent.  Clonus is not present.  Toes are down-going.  Bilateral upper and lower extremity sensation is intact to light touch.    Gait is normal.   No difficulty with tandem gait.   No evidence of dysmetria noted.  Medical Decision Making  Imaging: ***  I have personally reviewed the images and agree with the above interpretation.  Assessment and Plan: Ms. Teniyah Seivert is a pleasant 43 y.o. female  with ***    Thank you for involving me in the care of this patient.   I spent a total of *** minutes in both face-to-face and non-face-to-face activities for this visit on the date of this encounter.   Lyle Decamp, PA-C Dept. of Neurosurgery

## 2023-12-14 ENCOUNTER — Ambulatory Visit (INDEPENDENT_AMBULATORY_CARE_PROVIDER_SITE_OTHER): Admitting: Pulmonary Disease

## 2023-12-14 ENCOUNTER — Encounter (HOSPITAL_BASED_OUTPATIENT_CLINIC_OR_DEPARTMENT_OTHER): Payer: Self-pay | Admitting: Pulmonary Disease

## 2023-12-14 VITALS — BP 121/86 | HR 89 | Ht 67.0 in | Wt 189.0 lb

## 2023-12-14 DIAGNOSIS — F411 Generalized anxiety disorder: Secondary | ICD-10-CM | POA: Diagnosis not present

## 2023-12-14 DIAGNOSIS — R0609 Other forms of dyspnea: Secondary | ICD-10-CM

## 2023-12-14 DIAGNOSIS — J432 Centrilobular emphysema: Secondary | ICD-10-CM | POA: Diagnosis not present

## 2023-12-14 DIAGNOSIS — R0789 Other chest pain: Secondary | ICD-10-CM | POA: Diagnosis not present

## 2023-12-14 MED ORDER — STIOLTO RESPIMAT 2.5-2.5 MCG/ACT IN AERS: 2.0000 | INHALATION_SPRAY | Freq: Every day | RESPIRATORY_TRACT | 3 refills | Status: DC

## 2023-12-14 MED ORDER — ALBUTEROL SULFATE HFA 108 (90 BASE) MCG/ACT IN AERS: 2.0000 | INHALATION_SPRAY | Freq: Four times a day (QID) | RESPIRATORY_TRACT | 2 refills | Status: DC | PRN

## 2023-12-14 NOTE — Patient Instructions (Addendum)
 1- Switch Spiriva  to Stiolto 2 puffs in the AM 2- I sent a referral to pulmonary rehab which is two times a week for many weeks 3- Start exercising 15 - 20  minutes daily 4- Get echo done 5- See cardiologist 6- Keep seeing psychologist for therapy

## 2023-12-14 NOTE — Assessment & Plan Note (Signed)
 Patient is seeing psychiatry and doing therapy. I recommended she continue with that.

## 2023-12-14 NOTE — Progress Notes (Signed)
 Established Patient Pulmonology Office Visit   Subjective:  Patient ID: Melissa Black, female    DOB: October 07, 1980  MRN: [id number]  CC:  Chief Complaint  Patient presents with   Follow-up    HPI Melissa Black is a 43 y.o. adult with MDD, hyperhidrosis, goiter who presents for follow up.  Last seen on 11/30/2023, I thought dyspnea is multifactorial due to large goiter causing compression on trachea, emphysema. I obtained PFTs and D dimer to rule out obstruction/restriction or any clots.  She started taking Spiriva  since she's seen. It does not help at all. She does not have a rescue inhaler but previously tried it and made her anxious. Given her palpitations. PFTs relatively normal. Orthopnea +. No leg swelling. She has chest pain on and off. She has chest pain randomly, not on exertion. Sometimes chest pain radiates to the arm. She has not seen a heart attack. Not vaping anymore. Takes Cyclobenzeprine and Lorazepam  intermittently for panic attacks.  ROS    Current Outpatient Medications:    albuterol  (VENTOLIN  HFA) 108 (90 Base) MCG/ACT inhaler, Inhale 2 puffs into the lungs every 6 (six) hours as needed for wheezing or shortness of breath., Disp: 8 g, Rfl: 2   amitriptyline  (ELAVIL ) 25 MG tablet, Take 1 tablet (25 mg total) by mouth at bedtime., Disp: 90 tablet, Rfl: 0   cyclobenzaprine  (FLEXERIL ) 10 MG tablet, Take 1 tablet (10 mg total) by mouth 3 (three) times daily as needed for up to 20 days., Disp: 60 tablet, Rfl: 0   gabapentin  (NEURONTIN ) 600 MG tablet, Take 1.5 tablets (900 mg total) by mouth 3 (three) times daily., Disp: 135 tablet, Rfl: 0   hydrOXYzine  (ATARAX ) 10 MG tablet, TAKE 1 TABLET BY MOUTH 3 TIMES DAILY AS NEEDED FOR ANXIETY., Disp: 270 tablet, Rfl: 0   medroxyPROGESTERone (DEPO-PROVERA) 150 MG/ML injection, Inject into the muscle., Disp: , Rfl:    Multiple Vitamin (MULTIVITAMIN WITH MINERALS) TABS tablet, Take 1 tablet by mouth in the morning. One A Day  Multivitamin for Women, Disp: , Rfl:    Tiotropium Bromide-Olodaterol (STIOLTO RESPIMAT ) 2.5-2.5 MCG/ACT AERS, Inhale 2 puffs into the lungs daily., Disp: 1 each, Rfl: 3      Objective:  BP 121/86 (BP Location: Left Arm, Patient Position: Sitting)   Pulse 89   Ht 5' 7 (1.702 m)   Wt 189 lb (85.7 kg)   LMP 09/26/2023 (Exact Date)   SpO2 94%   BMI 29.60 kg/m  Wt Readings from Last 3 Encounters:  12/14/23 189 lb (85.7 kg)  12/04/23 187 lb 12.8 oz (85.2 kg)  11/30/23 190 lb (86.2 kg)   BMI Readings from Last 3 Encounters:  12/14/23 29.60 kg/m  12/04/23 29.41 kg/m  11/30/23 29.76 kg/m   SpO2 Readings from Last 3 Encounters:  12/14/23 94%  12/04/23 99%  11/30/23 100%    Physical Exam General: NAD, alert, WD, WN Eyes: PERRL, no scleral icterus ENMT: oropharynx clear, good dentition, no oral lesions, mallampati score I Skin: warm, intact, no rashes Neck: JVD flat, ROM and lymph node assessment normal, thyroid  gland appears enlarged especially on the R side. CV: RRR, no MRG, nl S1 and S2, no peripheral edema Resp: clear to auscultation bilaterally, no wheezes, rales, or rhonchi, normal effort, no clubbing/cyanosis Abdom: Normoactive bowel sounds, soft, nontender, nondistended, no hepatosplenomegaly Ext: edema Neuro: Awake alert oriented to person place time and situation   Diagnostic Review:  Last CBC Lab Results  Component Value Date  WBC 10.1 11/24/2023   HGB 12.4 11/24/2023   HCT 36.8 11/24/2023   MCV 91.1 11/24/2023   MCH 30.7 11/24/2023   RDW 12.5 11/24/2023   PLT 284 11/24/2023   PFTs: FEV1/SVC 79%, LLN 72%, there's slight scooping in flow volume loop. Borderline obstruction. CXR on 11/17/2023 relatively normal CT Chest 03/01/2023 shows mild upper lobe emphysematous changes as well as scattered < 6 mm pulmonary nodules. 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    Assessment & Plan:   Assessment &  Plan Centrilobular emphysema (HCC) Patient has hx of smoking and vaping. CT chest 07/2023 shows mild upper lobe emphysematous changes. PFTs without evidence of obstruction. MMRC > 2, no exacerbations. 1B. Tried LAMA (Spiriva ) without benefit. Will trial LAMA/LABA with Stiolto. I also gave her albuterol  MDI to use as needed. I referred her to pulmonary rehab to improve her exercise capacity. Dyspnea on exertion Unclear etiology at this time. PFTs relatively normal and does not explain dyspnea. She does have mild emphysema and treatment plan as above. There's no evidence of DVT/PE or anemia. She has a large thyroid  gland that is causing some compression on trachea which is not new but maybe causing some of her dyspnea symptoms. She will discuss with her endocrinologist regarding resection. She notes episodic periods of chest pain and dyspnea, I suspect related to anxiety/panic attacks. However, she has not had any prior cardiac work up. I ordered echo to rule out pulmonary hypertension or any structural heart disease and referred to cardiology to evaluate for ischemia given atypical chest pain. Atypical chest pain Referred to cardiology for consideration of ischemic evaluation. Risk factors include prior hx of smoking. GAD (generalized anxiety disorder) Patient is seeing psychiatry and doing therapy. I recommended she continue with that.  Orders Placed This Encounter  Procedures   AMB referral to pulmonary rehabilitation   Ambulatory referral to Cardiology   ECHOCARDIOGRAM COMPLETE   I spent 32 minutes reviewing patient's chart including prior consultant notes, imaging, and PFTs as well as face-to-face with the patient, over half in discussion of the diagnosis and the importance of compliance with the treatment plan.    No follow-ups on file.   Chucky Homes, MD

## 2023-12-19 ENCOUNTER — Ambulatory Visit: Admitting: Physician Assistant

## 2023-12-26 ENCOUNTER — Encounter: Payer: Self-pay | Admitting: *Deleted

## 2023-12-28 ENCOUNTER — Encounter: Payer: Self-pay | Admitting: Physician Assistant

## 2023-12-28 HISTORY — PX: THYROID SURGERY: SHX805

## 2023-12-28 NOTE — Progress Notes (Deleted)
 Cardiology Office Note    Date:  12/28/2023   ID:  Melissa Black, DOB 1980/10/10, MRN [id number]  PCP:  [facility]  Cardiologist:  None  Electrophysiologist:  None   Chief Complaint: Dyspnea  History of Present Illness:   Melissa Black is a 43 y.o. female with history of aortic atherosclerosis, pulmonary emphysema with prior tobacco use and half pack per day for 20 years quitting in 2022, large goiter compressing the trachea with leftward deviation status post thyroid  lobectomy on 12/28/2023, dysphagia, hyperhidrosis, ADHD, anxiety, MDD, and spinal stenosis with lumbar radiculopathy who presents for evaluation of dyspnea.  Remote nuclear stress test in 03/2010 showed no significant ischemia or scar with an EF greater than 65%.  She has been seen in the ED multiple times in 10/2023 for shortness of breath with reassuring workup including nonacute EKG, normal D-dimer, and multiple normal high-sensitivity troponin as well as normal pro-BNP.  CTA of the chest showed no evidence of PE or other acute findings with emphysema noted.  Normal main pulmonary artery size.  She has undergone evaluation by pulmonology with PFTs showing borderline obstructive pulmonary disease.  CT of the chest again showed emphysematous changes as well as pulmonary nodules.  She has been noted to have a large thyroid  goiter compressing on the trachea with leftward deviation felt to be contributory.  In this setting she underwent total thyroid  lobectomy at [facility] yesterday (12/28/2023).  She comes in today at the request of pulmonology for evaluation of atypical chest pain and dyspnea.  ***   Labs independently reviewed: 11/2023 - TSH normal, potassium 3.1, BUN 15, serum creatinine 0.78, Hgb 12.4, PLT 284 10/2023 - albumin 3.9, AST/ALT normal, magnesium 2.2, proBNP 38  Past Medical History:  Diagnosis Date   ADHD (attention deficit hyperactivity disorder)    Anemia    h/o   Anxiety     Asthma    Chest pain, unspecified    Genital herpes    History of chlamydia    History of gonorrhea    History of marijuana use    Obesity    SOB (shortness of breath)    Spinal stenosis of lumbar region with radiculopathy     Past Surgical History:  Procedure Laterality Date   CESAREAN SECTION     x1   FOOT SURGERY Right    broken toe   HEMI-MICRODISCECTOMY LUMBAR LAMINECTOMY LEVEL 1 Right 11/10/2021   Procedure: OPEN RIGHT REDO L5-S1 MICRODISCECTOMY;  Surgeon: Clois Fret, MD;  Location: [facility] ORS;  Service: Neurosurgery;  Laterality: Right;   LUMBAR LAMINECTOMY/DECOMPRESSION MICRODISCECTOMY Right 08/30/2021   Procedure: RIGHT L4-5 LAMINOFORAMINOTOMY, RIGHT L5-S1 MICRODISCECTOMY;  Surgeon: Clois Fret, MD;  Location: [facility] ORS;  Service: Neurosurgery;  Laterality: Right;    Current Medications: No outpatient medications have been marked as taking for the 12/29/23 encounter (Appointment) with Abigail Bernardino HERO, PA-C.    Allergies:   Codeine, Penicillins, Benadryl [diphenhydramine], and Bupropion   Social History   Socioeconomic History   Marital status: Divorced    Spouse name: Not on file   Number of children: 2   Years of education: Not on file   Highest education level: High school graduate  Occupational History   Not on file  Tobacco Use   Smoking status: Former    Current packs/day: 0.00    Average packs/day: 0.5 packs/day for 20.0 years (10.0 ttl pk-yrs)    Types: Cigarettes    Start date: 2002  Quit date: 2022    Years since quitting: 3.6   Smokeless tobacco: Never  Vaping Use   Vaping status: Every Day   Substances: Flavoring  Substance and Sexual Activity   Alcohol use: No   Drug use: Not Currently    Types: Marijuana   Sexual activity: Yes  Other Topics Concern   Not on file  Social History Narrative   Lives with boyfriend and daughter   Social Drivers of Corporate investment banker Strain: Not on file  Food Insecurity: No Food Insecurity  (12/15/2023)   Received from [facility]   Hunger Vital Sign    Within the past 12 months, you worried that your food would run out before you got the money to buy more.: Never true    Within the past 12 months, the food you bought just didn't last and you didn't have money to get more.: Never true  Transportation Needs: No Transportation Needs (12/15/2023)   Received from [facility]   PRAPARE - Transportation    Lack of Transportation (Medical): No    Lack of Transportation (Non-Medical): No  Physical Activity: Not on file  Stress: Not on file  Social Connections: Not on file     Family History:  The patient's family history includes ADD / ADHD in her son; Depression in her mother.  ROS:   12-point review of systems is negative unless otherwise noted in the HPI.   EKGs/Labs/Other Studies Reviewed:    Studies reviewed were summarized above. The additional studies were reviewed today:  Nuclear stress test 04/07/2010: Impression:  - Normal myocardial perfusion study  - No evidence for significant ischemia or scar is noted  - Global systolic function is hyperdynamic. The EF is greater  than 65%.  - Marked breast attenuation is noted  - Sensitivity and specificity of this test are reduced by the  noted attenuation    EKG:  EKG is ordered today.  The EKG ordered today demonstrates ***  Recent Labs: 11/17/2023: ALT 15 11/24/2023: BUN 15; Creatinine, Ser 0.78; Hemoglobin 12.4; Platelets 284; Potassium 3.1; Sodium 140; TSH 2.914  Recent Lipid Panel No results found for: CHOL, TRIG, HDL, CHOLHDL, VLDL, LDLCALC, LDLDIRECT  PHYSICAL EXAM:    VS:  There were no vitals taken for this visit.  BMI: There is no height or weight on file to calculate BMI.  Physical Exam  Wt Readings from Last 3 Encounters:  12/14/23 189 lb (85.7 kg)  12/04/23 187 lb 12.8 oz (85.2 kg)  11/30/23 190 lb (86.2 kg)     ASSESSMENT & PLAN:   Precordial pain with dyspnea:  Large  thyroid  goiter: Status post surgical resection on 12/28/2023.  Aortic atherosclerosis: Noted on CT imaging.  Pulmonary emphysema with pulmonary nodules: Management and follow-up imaging deferred to pulmonology.   {Are you ordering a CV Procedure (e.g. stress test, cath, DCCV, TEE, etc)?   Press F2        :[id number]}     Disposition: F/u with Dr. PIERRETTE or an APP in ***.   Medication Adjustments/Labs and Tests Ordered: Current medicines are reviewed at length with the patient today.  Concerns regarding medicines are outlined above. Medication changes, Labs and Tests ordered today are summarized above and listed in the Patient Instructions accessible in Encounters.   Signed, Bernardino Bring, PA-C 12/28/2023 12:58 PM     [facility] [place] [address] [place], [place] [address] [phone]

## 2023-12-29 ENCOUNTER — Ambulatory Visit: Admitting: Physician Assistant

## 2024-01-01 ENCOUNTER — Ambulatory Visit (HOSPITAL_COMMUNITY)
Admission: RE | Admit: 2024-01-01 | Source: Ambulatory Visit | Attending: Pulmonary Disease | Admitting: Pulmonary Disease

## 2024-01-01 NOTE — Addendum Note (Signed)
 Addended by: Izabelle Daus on: 01/01/2024 12:39 PM   Modules accepted: Orders

## 2024-01-02 ENCOUNTER — Ambulatory Visit: Attending: Cardiovascular Disease | Admitting: Cardiovascular Disease

## 2024-01-02 ENCOUNTER — Encounter: Payer: Self-pay | Admitting: Cardiovascular Disease

## 2024-01-02 VITALS — BP 115/82 | HR 101 | Ht 67.0 in | Wt 195.8 lb

## 2024-01-02 DIAGNOSIS — R0609 Other forms of dyspnea: Secondary | ICD-10-CM | POA: Insufficient documentation

## 2024-01-02 NOTE — Patient Instructions (Signed)
 Medication Instructions:  Your physician recommends that you continue on your current medications as directed. Please refer to the Current Medication list given to you today.  *If you need a refill on your cardiac medications before your next appointment, please call your pharmacy*   Follow-Up: At [facility], you and your health needs are our priority.  As part of our continuing mission to provide you with exceptional heart care, our providers are all part of one team.  This team includes your primary Cardiologist (physician) and Advanced Practice Providers or APPs (Physician Assistants and Nurse Practitioners) who all work together to provide you with the care you need, when you need it.  Your next appointment:   We will see you on an as needed basis.  Provider:   Lauro Portal, MD   We recommend signing up for the patient portal called "MyChart".  Sign up information is provided on this After Visit Summary.  MyChart is used to connect with patients for Virtual Visits (Telemedicine).  Patients are able to view lab/test results, encounter notes, upcoming appointments, etc.  Non-urgent messages can be sent to your provider as well.   To learn more about what you can do with MyChart, go to [email or web address].

## 2024-01-02 NOTE — Progress Notes (Signed)
 01/02/2024 Melissa Black Shoulder Melissa Black   1981/01/23  [id number]  [facility], [facility] Primary Cardiologist: Melissa JINNY Lesches MD FACP, [place], [place], [place]  HPI:  Melissa Black is a 43 y.o. 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  She was referred by her pulmonologist for dyspnea on exertion.  Risk factors include 20 pack years history of tobacco use having quit 4 years ago.  Subsequent to that she vaped.  She has no other risk factors.  There is no family history.  She is never had a heart attack or stroke.  She denies chest pain but does complain of significant shortness of breath beginning after she sprayed her house with a bug spray 11/17/2023.  She recently had a thyroidectomy for goiter.  She does admit to anxiety and is on antianxiety medications.   Current Meds  Medication Sig   albuterol  (VENTOLIN  HFA) 108 (90 Base) MCG/ACT inhaler Inhale 2 puffs into the lungs every 6 (six) hours as needed for wheezing or shortness of breath.   amitriptyline  (ELAVIL ) 25 MG tablet Take 1 tablet (25 mg total) by mouth at bedtime.   cyclobenzaprine  (FLEXERIL ) 10 MG tablet Take 1 tablet (10 mg total) by mouth 3 (three) times daily as needed for up to 20 days.   gabapentin  (NEURONTIN ) 600 MG tablet Take 1.5 tablets (900 mg total) by mouth 3 (three) times daily.   LORazepam  (ATIVAN ) 0.5 MG tablet Take 0.5 mg by mouth 3 (three) times daily as needed.   medroxyPROGESTERone (DEPO-PROVERA) 150 MG/ML injection Inject into the muscle.   Multiple Vitamin (MULTIVITAMIN WITH MINERALS) TABS tablet Take 1 tablet by mouth in the morning. One A Day Multivitamin for Women   oxyCODONE  (OXY IR/ROXICODONE ) 5 MG immediate release tablet Take 5 mg by mouth every 4 (four) hours as needed.     Allergies  Allergen Reactions   Codeine Swelling    Penicillins Swelling and Other (See Comments)    TOLERATED CEFAZOLIN  Has patient had a PCN reaction causing immediate rash, facial/tongue/throat swelling, SOB or lightheadedness with hypotension: yes Has patient had a PCN reaction causing severe rash involving mucus membranes or skin necrosis: no Has patient had a PCN reaction that required hospitalization no Has patient had a PCN reaction occurring within the last 10 years: no If all of the above answers are NO, then may proceed with Cephalosporin use.    Benadryl [Diphenhydramine] Hives   Bupropion Palpitations    Heart palpitations.    Social History   Socioeconomic History   Marital status: Divorced    Spouse name: Not on file   Number of children: 2   Years of education: Not on file   Highest education level: High school graduate  Occupational History   Not on file  Tobacco Use   Smoking status: Former    Current packs/day: 0.00    Average packs/day: 0.5 packs/day for 20.0 years (10.0 ttl pk-yrs)    Types: Cigarettes    Start date: 2002    Quit date: 2022    Years since quitting: 3.6   Smokeless tobacco: Never  Vaping Use   Vaping status: Every Day   Substances: Flavoring  Substance and Sexual Activity   Alcohol use: No   Drug use: Not Currently    Types: Marijuana   Sexual activity: Yes  Other  Topics Concern   Not on file  Social History Narrative   Lives with boyfriend and daughter   Social Drivers of Health   Financial Resource Strain: Not on file  Food Insecurity: No Food Insecurity (12/15/2023)   Received [facility]   Hunger Vital Sign    Within the past 12 months, you worried that your food would run out before you got the money to buy more.: Never true    Within the past 12 months, the food you bought just didn't last and you didn't have money to get more.: Never true  Transportation Needs: No Transportation Needs (12/15/2023)   Received from [facility] - Transportation     Lack of Transportation (Medical): No    Lack of Transportation (Non-Medical): No  Physical Activity: Not on file  Stress: Not on file  Social Connections: Not on file  Intimate Partner Violence: Not on file     Review of Systems: General: negative for chills, fever, night sweats or weight changes.  Cardiovascular: negative for chest pain, dyspnea on exertion, edema, orthopnea, palpitations, paroxysmal nocturnal dyspnea or shortness of breath Dermatological: negative for rash Respiratory: negative for cough or wheezing Urologic: negative for hematuria Abdominal: negative for nausea, vomiting, diarrhea, bright red blood per rectum, melena, or hematemesis Neurologic: negative for visual changes, syncope, or dizziness All other systems reviewed and are otherwise negative except as noted above.    Blood pressure 115/82, pulse (!) 101, height 5' 7 (1.702 m), weight 195 lb 12.8 oz (88.8 kg), SpO2 99%.  General appearance: alert and no distress Neck: no adenopathy, no carotid bruit, no JVD, supple, symmetrical, trachea midline, and thyroid  not enlarged, symmetric, no tenderness/mass/nodules Lungs: clear to auscultation bilaterally Heart: regular rate and rhythm, S1, S2 normal, no murmur, click, rub or gallop Extremities: extremities normal, atraumatic, no cyanosis or edema Pulses: 2+ and symmetric Skin: Skin color, texture, turgor normal. No rashes or lesions Neurologic: Grossly normal  EKG not performed today      ASSESSMENT AND PLAN:   Dyspnea on exertion [name] was referred to me by Dr.Alghanim, her pulmonologist, for dyspnea on exertion.  She does have a 20-pack-year history of tobacco abuse having quit 4 years ago.  She did vape after that.  She relates onset of shortness of breath 2 days after spraying her house with bug spray 11/17/2023.  Her workup has been unrevealing including a chest CT done at [facility] 11/21/2023 that was essentially normal.  I do not think that her symptoms  are cardiovascular.  A 2D echo has already been ordered.     Melissa DOROTHA Lesches MD FACP,FACC,FAHA, [facility] 01/02/2024 2:48 PM

## 2024-01-02 NOTE — Assessment & Plan Note (Signed)
 [name] was referred to me by Dr.Alghanim, her pulmonologist, for dyspnea on exertion.  She does have a 20-pack-year history of tobacco abuse having quit 4 years ago.  She did vape after that.  She relates onset of shortness of breath 2 days after spraying her house with bug spray 11/17/2023.  Her workup has been unrevealing including a chest CT done at [facility] 11/21/2023 that was essentially normal.  I do not think that her symptoms are cardiovascular.  A 2D echo has already been ordered.

## 2024-01-03 ENCOUNTER — Other Ambulatory Visit: Payer: Self-pay

## 2024-01-03 ENCOUNTER — Ambulatory Visit (INDEPENDENT_AMBULATORY_CARE_PROVIDER_SITE_OTHER): Admitting: Psychiatry

## 2024-01-03 ENCOUNTER — Encounter: Payer: Self-pay | Admitting: Psychiatry

## 2024-01-03 VITALS — BP 114/80 | HR 104 | Temp 97.5°F | Ht 67.0 in | Wt 193.8 lb

## 2024-01-03 DIAGNOSIS — F411 Generalized anxiety disorder: Secondary | ICD-10-CM

## 2024-01-03 DIAGNOSIS — Z79899 Other long term (current) drug therapy: Secondary | ICD-10-CM

## 2024-01-03 DIAGNOSIS — F331 Major depressive disorder, recurrent, moderate: Secondary | ICD-10-CM | POA: Diagnosis not present

## 2024-01-03 DIAGNOSIS — Z9089 Acquired absence of other organs: Secondary | ICD-10-CM | POA: Insufficient documentation

## 2024-01-03 MED ORDER — MIRTAZAPINE 7.5 MG PO TABS: 7.5000 mg | ORAL_TABLET | Freq: Every day | ORAL | 1 refills | Status: DC

## 2024-01-03 MED ORDER — HYDROXYZINE HCL 10 MG PO TABS: 10.0000 mg | ORAL_TABLET | Freq: Three times a day (TID) | ORAL | Status: DC | PRN

## 2024-01-03 NOTE — Progress Notes (Unsigned)
 [facility] MD OP Progress Note  01/03/2024 1:24 PM Melissa Black  MRN:  [id number]  Chief Complaint:  Chief Complaint  Patient presents with   Follow-up   Anxiety   Depression   Medication Refill   Discussed the use of AI scribe software for clinical note transcription with the patient, who gave verbal consent to proceed.  History of Present Illness Melissa Black is a 43 year old with a history of longstanding depression and anxiety presenting with worsening depressive and anxiety symptoms, sleep disturbance, and medication concerns.  Persistent depressive symptoms continue, including low mood, feelings of loneliness, low energy, and a preference for isolation. Ongoing difficulty sleeping affects her, as she notes sleeping only a few hours before waking. She reports that her sleep improved while taking amitriptyline , but she discontinued it due to increased irritability and anger. Excessive worrying, feeling jittery, and an inability to relax also impact her daily life. She describes a worsening of depression related to ongoing breathing difficulties, which affect her ability to eat and perform daily activities.  Currently, she uses lorazepam  as needed for panic and anxiety, typically once every other day or less, especially during episodes of severe shortness of breath. She also takes hydroxyzine  as needed for anxiety, with a previous dose of 10 mg. She usually uses gabapentin  600 mg as needed, usually twice a week or less, primarily for muscle relaxation during breathing episodes. She does not currently use oxycodone  and reports using only Tylenol  for pain management.  She denies suicidal thoughts and thoughts of harming others during the visit.  Psychiatric History: She reports a history of depression and anxiety for over 10 years. She has previously tried multiple antidepressants, including fluoxetine, bupropion, venlafaxine, duloxetine , citalopram, and amitriptyline , with either  lack of improvement or side effects. She has not attended therapy, though she previously received resources.  Substance History: She reports past use of oxycodone  following arm surgery, stating she took 1 dose and did not take any more because it was too strong, and has since used only Tylenol  as recommended.  Social History: She lives with her mother and 61 year old daughter.  Medical History: She has a history of thyroid  surgery and back surgery. She reports allergies to Benadryl and paracetamol codeine.  Visit Diagnosis:    ICD-10-CM   1. MDD (major depressive disorder), recurrent episode, moderate (HCC)  F33.1 mirtazapine  (REMERON ) 7.5 MG tablet    2. GAD (generalized anxiety disorder)  F41.1 hydrOXYzine  (ATARAX ) 10 MG tablet    mirtazapine  (REMERON ) 7.5 MG tablet    3. High risk medication use  Z79.899 Urine drugs of abuse scrn w alc, routine (Ref Lab)      Past Psychiatric History: I have reviewed past psychiatric history from progress note on 07/31/2023.  Past Medical History:  Past Medical History:  Diagnosis Date   ADHD (attention deficit hyperactivity disorder)    Anemia    h/o   Anxiety    Asthma    Chest pain, unspecified    Genital herpes    History of chlamydia    History of gonorrhea    History of marijuana use    Obesity    SOB (shortness of breath)    Spinal stenosis of lumbar region with radiculopathy     Past Surgical History:  Procedure Laterality Date   CESAREAN SECTION     x1   FOOT SURGERY Right    broken toe   HEMI-MICRODISCECTOMY LUMBAR LAMINECTOMY LEVEL 1 Right 11/10/2021   Procedure: OPEN  RIGHT REDO L5-S1 MICRODISCECTOMY;  Surgeon: Clois Fret, MD;  Location: [facility] ORS;  Service: Neurosurgery;  Laterality: Right;   LUMBAR LAMINECTOMY/DECOMPRESSION MICRODISCECTOMY Right 08/30/2021   Procedure: RIGHT L4-5 LAMINOFORAMINOTOMY, RIGHT L5-S1 MICRODISCECTOMY;  Surgeon: Clois Fret, MD;  Location: [facility] ORS;  Service: Neurosurgery;   Laterality: Right;    Family Psychiatric History: I have reviewed family psychiatric history from progress note on 07/31/2023.  Family History:  Family History  Problem Relation Age of Onset   Depression Mother    ADD / ADHD Son     Social History: I have reviewed social history from progress note on 07/31/2023. Social History   Socioeconomic History   Marital status: Divorced    Spouse name: Not on file   Number of children: 2   Years of education: Not on file   Highest education level: High school graduate  Occupational History   Not on file  Tobacco Use   Smoking status: Former    Current packs/day: 0.00    Average packs/day: 0.5 packs/day for 20.0 years (10.0 ttl pk-yrs)    Types: Cigarettes    Start date: 2002    Quit date: 2022    Years since quitting: 3.6   Smokeless tobacco: Never  Vaping Use   Vaping status: Every Day   Substances: Flavoring  Substance and Sexual Activity   Alcohol use: No   Drug use: Not Currently    Types: Marijuana   Sexual activity: Yes  Other Topics Concern   Not on file  Social History Narrative   Lives with boyfriend and daughter   Social Drivers of Corporate investment banker Strain: Not on file  Food Insecurity: No Food Insecurity (12/15/2023)   Received from [facility]   Hunger Vital Sign    Within the past 12 months, you worried that your food would run out before you got the money to buy more.: Never true    Within the past 12 months, the food you bought just didn't last and you didn't have money to get more.: Never true  Transportation Needs: No Transportation Needs (12/15/2023)   Received from [facility]   PRAPARE - Transportation    Lack of Transportation (Medical): No    Lack of Transportation (Non-Medical): No  Physical Activity: Not on file  Stress: Not on file  Social Connections: Not on file    Allergies:  Allergies  Allergen Reactions   Codeine Swelling   Penicillins Swelling and Other (See Comments)     TOLERATED CEFAZOLIN  Has patient had a PCN reaction causing immediate rash, facial/tongue/throat swelling, SOB or lightheadedness with hypotension: yes Has patient had a PCN reaction causing severe rash involving mucus membranes or skin necrosis: no Has patient had a PCN reaction that required hospitalization no Has patient had a PCN reaction occurring within the last 10 years: no If all of the above answers are NO, then may proceed with Cephalosporin use.    Benadryl [Diphenhydramine] Hives   Bupropion Palpitations    Heart palpitations.    Metabolic Disorder Labs: No results found for: HGBA1C, MPG No results found for: PROLACTIN No results found for: CHOL, TRIG, HDL, CHOLHDL, VLDL, LDLCALC Lab Results  Component Value Date   TSH 2.914 11/24/2023    Therapeutic Level Labs: No results found for: LITHIUM No results found for: VALPROATE No results found for: CBMZ  Current Medications: Current Outpatient Medications  Medication Sig Dispense Refill   gabapentin  (NEURONTIN ) 600 MG tablet Take 1.5 tablets (900  mg total) by mouth 3 (three) times daily. (Patient taking differently: Take 900 mg by mouth daily as needed.) 135 tablet 0   mirtazapine  (REMERON ) 7.5 MG tablet Take 1 tablet (7.5 mg total) by mouth at bedtime. 30 tablet 1   albuterol  (VENTOLIN  HFA) 108 (90 Base) MCG/ACT inhaler Inhale 2 puffs into the lungs every 6 (six) hours as needed for wheezing or shortness of breath. 8 g 2   cyclobenzaprine  (FLEXERIL ) 10 MG tablet Take 1 tablet (10 mg total) by mouth 3 (three) times daily as needed for up to 20 days. 60 tablet 0   hydrOXYzine  (ATARAX ) 10 MG tablet Take 1 tablet (10 mg total) by mouth 3 (three) times daily as needed for anxiety.     LORazepam  (ATIVAN ) 0.5 MG tablet Take 0.5 mg by mouth 3 (three) times daily as needed.     medroxyPROGESTERone (DEPO-PROVERA) 150 MG/ML injection Inject into the muscle.     Multiple Vitamin (MULTIVITAMIN WITH  MINERALS) TABS tablet Take 1 tablet by mouth in the morning. One A Day Multivitamin for Women     Tiotropium Bromide-Olodaterol (STIOLTO RESPIMAT ) 2.5-2.5 MCG/ACT AERS Inhale 2 puffs into the lungs daily. (Patient not taking: Reported on 01/02/2024) 1 each 3   No current facility-administered medications for this visit.     Musculoskeletal: Strength & Muscle Tone: within normal limits Gait & Station: normal Patient leans: N/A  Psychiatric Specialty Exam: Review of Systems  Blood pressure 114/80, pulse (!) 104, temperature (!) 97.5 F (36.4 C), temperature source Temporal, height 5' 7 (1.702 m), weight 193 lb 12.8 oz (87.9 kg).Body mass index is 30.35 kg/m.  General Appearance: Casual  Eye Contact:  Fair  Speech:  Clear and Coherent  Volume:  Normal  Mood:  Anxious and Depressed  Affect:  Appropriate  Thought Process:  Goal Directed and Descriptions of Associations: Intact  Orientation:  Full (Time, Place, and Person)  Thought Content: Logical   Suicidal Thoughts:  No  Homicidal Thoughts:  No  Memory:  Immediate;   Fair Recent;   Fair Remote;   Fair  Judgement:  Fair  Insight:  Fair  Psychomotor Activity:  Normal  Concentration:  Concentration: Fair and Attention Span: Fair  Recall:  [employer] of Knowledge: Fair  Language: Fair  Akathisia:  No  Handed:  Right  AIMS (if indicated): not done  Assets:  Communication Skills Desire for Improvement Housing Social Support  ADL's:  Intact  Cognition: WNL  Sleep:  Poor   Screenings: GAD-7    Flowsheet Row Office Visit from 07/31/2023 in [facility]  Total GAD-7 Score 15   PHQ2-9    Flowsheet Row Office Visit from 07/31/2023 in [facility]  PHQ-2 Total Score 5  PHQ-9 Total Score 16   Flowsheet Row ED from 11/24/2023 in [facility] Emergency Department at [facility] ED from 11/17/2023 in [facility] Emergency Department at [facility] Office Visit from 07/31/2023 in [facility]  C-SSRS RISK CATEGORY No Risk No Risk No Risk     Assessment and Plan: ***  Collaboration of Care: Collaboration of Care: [facility] OP Collaboration of Rjmz:[id number]}  Patient/Guardian was advised Release of Information must be obtained prior to any record release in order to collaborate their care with an outside provider. Patient/Guardian was advised if they have not already done so to contact the registration department to sign all necessary forms in order for us  to  release information regarding their care.   Consent: Patient/Guardian gives verbal consent for treatment and assignment of benefits for services provided during this visit. Patient/Guardian expressed understanding and agreed to proceed.    Melissa Pflum, MD 01/03/2024, 1:24 PM

## 2024-01-03 NOTE — Patient Instructions (Addendum)
 Mirtazapine  Tablets What is this medication? MIRTAZAPINE  (mir TAZ a peen) treats depression. It increases the amount of serotonin and norepinephrine in the brain, substances that help regulate mood. This medicine may be used for other purposes; ask your health care provider or pharmacist if you have questions. COMMON BRAND NAME(S): Remeron  What should I tell my care team before I take this medication? They need to know if you have any of these conditions: Bipolar disorder Dehydration Glaucoma Have had a stroke Heart or blood vessel conditions Kidney disease Liver disease Low blood pressure Low levels of sodium in the blood Low white blood cell levels Seizures Suicidal thoughts, plans, or attempt An unusual or allergic reaction to mirtazapine , other medications, foods, dyes, or preservatives Pregnant or trying to get pregnant Breastfeeding How should I use this medication? Take this medication by mouth with water. Take it as directed on the prescription label at the same time every day. You can take it with or without food. If it upsets your stomach, take it with food. Keep taking this medication unless your care team tells you to stop. Stopping it too quickly can cause serious side effects. It can also make your condition worse. A special MedGuide will be given to you by the pharmacist with each prescription and refill. Be sure to read this information carefully each time. Talk to your care team about the use of this medication in children. Special care may be needed. Overdosage: If you think you have taken too much of this medicine contact a poison control center or emergency room at once. NOTE: This medicine is only for you. Do not share this medicine with others. What if I miss a dose? If you miss a dose, take it as soon as you can. If it is almost time for your next dose, take only that dose. Do not take double or extra doses. What may interact with this medication? Do not take  this medication with any of the following: Cisapride Dronedarone Levoketoconazole Linezolid MAOIs, such as Marplan, Nardil, and Parnate Methylene blue Pimozide Some medications for fungal infections, such as ketoconazole, posaconazole, voriconazole Thioridazine This medication may also interact with the following: Alcohol Benzodiazepines, such as lorazepam  Cimetidine Clarithromycin Other medications that cause heart rhythm changes Opioids Rifampin Some medications for depression, anxiety, or other mental health conditions Some medication for migraines, such as sumatriptan Some medications for seizures, such as carbamazepine or phenytoin Stimulant medications for ADHD, weight loss, or staying awake Supplements, such as St. John's wort or tryptophan Warfarin Other medications may affect the way this medication works. Talk with your care team about all the medications you take. They may suggest changes to your treatment plan to lower the risk of side effects and to make sure your medications work as intended. This list may not describe all possible interactions. Give your health care provider a list of all the medicines, herbs, non-prescription drugs, or dietary supplements you use. Also tell them if you smoke, drink alcohol, or use illegal drugs. Some items may interact with your medicine. What should I watch for while using this medication? Visit your care team for regular checks on your progress. It may be some time before you see the benefit from this medication. This medication may worsen depression and cause thoughts of suicide. This can happen at any time but is more common after first starting treatment and after a change in dose. Talk to your care team right away if you have changes in mood and behavior or  thoughts of self-harm or suicide. They can help you. This medication may affect your coordination, reaction time, or judgment. Do not drive or operate machinery until you know  how this medication affects you. Sit up or stand slowly to reduce the risk of dizzy or fainting spells. Drinking alcohol with this medication can increase the risk of these side effects. Your mouth may get dry. Chewing sugarless gum or sucking hard candy and drinking plenty of water may help. Contact your care team if the problem does not go away or is severe. What side effects may I notice from receiving this medication? Side effects that you should report to your care team as soon as possible: Allergic reactions--skin rash, itching, hives, swelling of the face, lips, tongue, or throat Heart rhythm changes--fast or irregular heartbeat, dizziness, feeling faint or lightheaded, chest pain, trouble breathing Infection--fever, chills, cough, or sore throat Irritability, confusion, fast or irregular heartbeat, muscle stiffness, twitching muscles, sweating, high fever, seizure, chills, vomiting, diarrhea, which may be signs of serotonin syndrome Low sodium level--muscle weakness, fatigue, dizziness, headache, confusion Rash, fever, and swollen lymph nodes Redness, blistering, peeling, or loosening of the skin, including inside the mouth Seizures Sudden eye pain or change in vision such as blurry vision, seeing halos around lights, vision loss Thoughts of suicide or self-harm, worsening mood, feelings of depression Side effects that usually do not require medical attention (report these to your care team if they continue or are bothersome): Constipation Dizziness Drowsiness Dry mouth Increase in appetite Weight gain This list may not describe all possible side effects. Call your doctor for medical advice about side effects. You may report side effects to FDA at 1-800-FDA-1088. Where should I keep my medication? Keep out of the reach of children and pets. Store at room temperature between 20 and 25 degrees C (68 and 77 degrees F). Protect from light and moisture. Keep the container tightly closed.  Get rid of any unused medication after the expiration date. To get rid of medications that are no longer needed or have expired: Take the medication to a take-back program. Check with your pharmacy or law enforcement to find a location. If you cannot return the medication, check the label or package insert to see if the medication should be thrown out in the garbage or flushed down the toilet. If you are not sure, ask your care team. If it is safe to put it in the trash, empty the medication out of the container. Mix it with cat litter, dirt, coffee grounds, or another unwanted substance. Seal the mixture in a bag or container. Put it in the trash. NOTE: This sheet is a summary. It may not cover all possible information. If you have questions about this medicine, talk to your doctor, pharmacist, or health care provider.  www.openpathcollective.org  www.psychologytoday  [facility].wixsite.com [facility] [address], [place], [place] [address]   [phone]  [facility] [email or web address] [address], [place], [place] [address]  [phone]  [facility] [email or web address] [address], [place], [place] [address]  [phone]   [facility] - [phone]  [facility] - [phone]  [facility] - [phone] counseling [phone]  [facility] - [phone]   [facility] has interns who offer sliding scale rates and some of the full time clinicians do, as well. You complete their contact  form on their website and the referrals coordinator will help to get connected to someone  [facility] [address] [place], [place] [address]  [phone]    Belvie Chancy [address] [place], [place] [address]  [phone]    [facility] [address] [place], [place] [address]  [phone]      2025 Elsevier/Gold Standard (2023-08-09 00:00:00)

## 2024-01-05 ENCOUNTER — Encounter

## 2024-01-17 ENCOUNTER — Telehealth: Payer: Self-pay

## 2024-01-17 NOTE — Telephone Encounter (Signed)
**Note De-identified  [place] Obfuscation** Please advise 

## 2024-01-17 NOTE — H&P (Signed)
 PRE-PROCEDURE HISTORY AND PHYSICAL EXAM  Melissa Black presents for her scheduled UGI ENDOSCOPY; WITH BIOPSY, SINGLE OR MULTIPLE.   The indication for the procedure(s) is Abdominal pain, epigastric [R10.13].    There have been no significant recent changes in the patient's medical status.  Past Medical History[1] Past Surgical History[2]  Allergies Allergies[3]  Medications LORazepam , albuterol , aluminum chloride, amitriptyline , cyclobenzaprine , glycopyrrolate , medroxyPROGESTERone, (multivit,calc,mins/iron/folic), oxyCODONE , pediatric multivitamin, and tiotropium bromide  Physical Examination Vitals:   01/17/24 1425  BP: 115/82  Pulse: 105  Resp: 18  Temp:   SpO2: 100%   Body mass index is 30.07 kg/m.  Mental Status: AAOx3, thoughts organized   Lungs: unlabored breathing      Abdomen: Soft, non-tender, non-distended     ASSESSMENT AND PLAN Melissa Black has been evaluated and deemed appropriate to undergo the planned UGI ENDOSCOPY; WITH BIOPSY, SINGLE OR MULTIPLE.  The patient, or her authorized representative, was provided an explanation of the nature and benefits of the procedure(s), the most frequent risks, and alternatives, if any.  I personally reviewed this information with the patient, or her authorized representative, and answered all questions.       [1] Past Medical History: Diagnosis Date  . Depression   . Disease of thyroid  gland   [2] Past Surgical History: Procedure Laterality Date  . BACK SURGERY  2023   x2  . CESAREAN SECTION    . PR THYROID  LOBECTOMY,UNILAT Right 12/28/2023   Procedure: TOTAL THYROID  LOBECTOMY, UNILATERAL; WITH OR WITHOUT ISTHMUSECTOMY;  Surgeon: Liliane Alm Musca;  Location: OR [facility];  Service: ENT  [3] Allergies Allergen Reactions  . Penicillins Hives, Shortness Of Breath and Angioedema  . Benadryl Allergy Decongestant Hives and Anxiety  . Codeine Hives and Nausea And Vomiting

## 2024-01-17 NOTE — Telephone Encounter (Signed)
 Sent message regarding prior mychart encounter

## 2024-01-18 ENCOUNTER — Ambulatory Visit

## 2024-01-19 ENCOUNTER — Other Ambulatory Visit
Admission: RE | Admit: 2024-01-19 | Discharge: 2024-01-19 | Disposition: A | Discharge status: Home / Self Care | Attending: Psychiatry | Admitting: Psychiatry

## 2024-01-19 ENCOUNTER — Encounter (HOSPITAL_BASED_OUTPATIENT_CLINIC_OR_DEPARTMENT_OTHER): Payer: Self-pay

## 2024-01-19 DIAGNOSIS — Z79899 Other long term (current) drug therapy: Secondary | ICD-10-CM | POA: Insufficient documentation

## 2024-01-19 NOTE — Telephone Encounter (Signed)
 Patient was contacted by our office to set up appt with APP this week but there was no response. I will see her in my [place] clinic on 9/30 after her echo on 9/29. I previously discussed with patient warning signs to go to ED. For now will await echo results and see her physically on 9/30.

## 2024-01-19 NOTE — Telephone Encounter (Signed)
 FYI

## 2024-01-22 ENCOUNTER — Other Ambulatory Visit (HOSPITAL_BASED_OUTPATIENT_CLINIC_OR_DEPARTMENT_OTHER)

## 2024-01-22 ENCOUNTER — Encounter: Payer: Self-pay | Admitting: Internal Medicine

## 2024-01-22 ENCOUNTER — Telehealth: Payer: Self-pay

## 2024-01-22 DIAGNOSIS — F411 Generalized anxiety disorder: Secondary | ICD-10-CM

## 2024-01-22 LAB — URINE DRUGS OF ABUSE SCREEN W ALC, ROUTINE (REF LAB)
Amphetamines, Urine: NEGATIVE ng/mL
Barbiturate, Ur: NEGATIVE ng/mL
Benzodiazepine Quant, Ur: NEGATIVE ng/mL
Cannabinoid Quant, Ur: NEGATIVE ng/mL
Cocaine (Metab.): NEGATIVE ng/mL
Creatinine, Urine: 183.2 mg/dL (ref 20.0–300.0)
Ethanol U, Quan: NEGATIVE %
Methadone Screen, Urine: NEGATIVE ng/mL
Nitrite Urine, Quantitative: NEGATIVE ug/mL
OPIATE SCREEN URINE: NEGATIVE ng/mL
Phencyclidine, Ur: NEGATIVE ng/mL
Propoxyphene, Urine: NEGATIVE ng/mL
pH, Urine: 7 (ref 4.5–8.9)

## 2024-01-22 MED ORDER — LORAZEPAM 0.5 MG PO TABS: 0.2500 mg | ORAL_TABLET | ORAL | 0 refills | Status: DC

## 2024-01-22 NOTE — Telephone Encounter (Signed)
 I have sent lorazepam  0.25-0.5 mg as needed for severe panic attacks.  It was already discussed during her session to limit the amount of lorazepam  and use it only as needed for severe anxiety situations and that lorazepam  will be prescribed for a short-term basis only.  I have reviewed urine drug screen.  Negative for drugs tested.

## 2024-01-22 NOTE — Telephone Encounter (Signed)
 Called patient to inform of the message from provider she voiced understanding

## 2024-01-22 NOTE — Progress Notes (Unsigned)
   Established Patient Pulmonology Office Visit   Subjective:  Patient ID: Melissa Black, female    DOB: [birth year]  MRN: [id number]  CC: No chief complaint on file.   HPI  Melissa Black is a 43 y.o. adult with MDD, hyperhidrosis, goiter now s/p R hemithyroidectomy who presents for follow up.   Last seen 11/2023, at the time complained of dyspnea. Referred to cardiology who did not recommend any type of ischemic evaluation. I ordered echocardiogram, appt scheduled 10/22. Upgraded Spiriva  to Stiolto.   {PULM QUESTIONNAIRES (Optional):33196}  ROS  {History (Optional):23778}  Current Outpatient Medications:    albuterol  (VENTOLIN  HFA) 108 (90 Base) MCG/ACT inhaler, Inhale 2 puffs into the lungs every 6 (six) hours as needed for wheezing or shortness of breath., Disp: 8 g, Rfl: 2   cyclobenzaprine  (FLEXERIL ) 10 MG tablet, Take 1 tablet (10 mg total) by mouth 3 (three) times daily as needed for up to 20 days., Disp: 60 tablet, Rfl: 0   gabapentin  (NEURONTIN ) 600 MG tablet, Take 1.5 tablets (900 mg total) by mouth 3 (three) times daily. (Patient taking differently: Take 900 mg by mouth daily as needed.), Disp: 135 tablet, Rfl: 0   hydrOXYzine  (ATARAX ) 10 MG tablet, Take 1 tablet (10 mg total) by mouth 3 (three) times daily as needed for anxiety., Disp: , Rfl:    LORazepam  (ATIVAN ) 0.5 MG tablet, Take 0.5 mg by mouth 3 (three) times daily as needed., Disp: , Rfl:    medroxyPROGESTERone (DEPO-PROVERA) 150 MG/ML injection, Inject into the muscle., Disp: , Rfl:    mirtazapine  (REMERON ) 7.5 MG tablet, Take 1 tablet (7.5 mg total) by mouth at bedtime., Disp: 30 tablet, Rfl: 1   Multiple Vitamin (MULTIVITAMIN WITH MINERALS) TABS tablet, Take 1 tablet by mouth in the morning. One A Day Multivitamin for Women, Disp: , Rfl:    Tiotropium Bromide-Olodaterol (STIOLTO RESPIMAT ) 2.5-2.5 MCG/ACT AERS, Inhale 2 puffs into the lungs daily. (Patient not taking: Reported on 01/02/2024), Disp: 1 each,  Rfl: 3      Objective:  There were no vitals taken for this visit. {Pulm Vitals (Optional):32837}  Physical Exam   Diagnostic Review:  {Labs (Optional):32838}  PFTs: FEV1/SVC 79%, LLN 72%, there's slight scooping in flow volume loop. Borderline obstruction. CXR on 11/17/2023 relatively normal CT Chest 03/01/2023 shows mild upper lobe emphysematous changes as well as scattered < 6 mm pulmonary nodules. 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 CTA Chest 10/2023: mild emphysema IMPRESSION:   No pulmonary embolism or other acute findings.     Assessment & Plan:   Assessment & Plan Centrilobular emphysema (HCC)  Dyspnea on exertion  Atypical chest pain   No orders of the defined types were placed in this encounter.     No follow-ups on file.   Rory Montel, MD

## 2024-01-22 NOTE — Telephone Encounter (Signed)
 Patient called to inquire about her UDS she is wanting to know if you have the results so that she is able to get her medication please advise

## 2024-01-23 ENCOUNTER — Ambulatory Visit (INDEPENDENT_AMBULATORY_CARE_PROVIDER_SITE_OTHER): Admitting: Pulmonary Disease

## 2024-01-23 ENCOUNTER — Encounter: Payer: Self-pay | Admitting: Pulmonary Disease

## 2024-01-23 ENCOUNTER — Telehealth: Payer: Self-pay

## 2024-01-23 VITALS — BP 115/82 | HR 92 | Ht 67.0 in | Wt 201.8 lb

## 2024-01-23 DIAGNOSIS — F411 Generalized anxiety disorder: Secondary | ICD-10-CM | POA: Diagnosis not present

## 2024-01-23 DIAGNOSIS — G4730 Sleep apnea, unspecified: Secondary | ICD-10-CM | POA: Diagnosis not present

## 2024-01-23 DIAGNOSIS — J432 Centrilobular emphysema: Secondary | ICD-10-CM | POA: Diagnosis not present

## 2024-01-23 DIAGNOSIS — Z23 Encounter for immunization: Secondary | ICD-10-CM | POA: Diagnosis not present

## 2024-01-23 DIAGNOSIS — R0789 Other chest pain: Secondary | ICD-10-CM

## 2024-01-23 DIAGNOSIS — R0609 Other forms of dyspnea: Secondary | ICD-10-CM

## 2024-01-23 NOTE — Telephone Encounter (Signed)
 Called patients pharmacy spoke to Henning she stated that patient was flagged due to having the Lorazepam  prescribed by a different provider [place]) which was picked up on 01-03-24 for a 5 day supply

## 2024-01-23 NOTE — Telephone Encounter (Signed)
 That is the primary care provider , she transitioned to our care . So as long as two providers are not prescribing at the same time , its OK to fill my script . Please let pharmacy know.

## 2024-01-23 NOTE — Patient Instructions (Signed)
-   Please complete the echo, cardiopulmonary exercise test, and the sleep test - Come back to my office after that is done to discuss results.

## 2024-01-23 NOTE — Telephone Encounter (Signed)
 Pharmacy was aware that was cleared I was just informing you of what was going on with the fax that you gave me

## 2024-01-23 NOTE — Assessment & Plan Note (Signed)
 Unclear etiology at this time. PFTs relatively normal and does not explain dyspnea. She does have mild emphysema on chest imaging but symptoms are out of proportion for that. There's no evidence of DVT/PE on most recent CTA chest performed November 21, 2023. She has no anemia on her updated labs. She had a large thyroid  gland that is causing some compression on trachea now s/p hemithyroidectomy without much improvement in dyspnea. I ordered echo to rule out pulmonary hypertension or any structural heart disease. Cardiology saw her and don't think she has any need for further work up from a cardiac standpoint. The patient does have a hx of GAD and panic attacks. Dyspnea consistently improves with Lorazepam  administration. I suspect there maybe a component of anxiety causing dyspnea. To finalize the work up, I recommended doing a CPET and patient is agreeable since she would be monitored during that.

## 2024-01-25 ENCOUNTER — Ambulatory Visit: Payer: Self-pay | Admitting: Psychiatry

## 2024-01-26 ENCOUNTER — Other Ambulatory Visit: Payer: Self-pay | Admitting: Psychiatry

## 2024-01-26 ENCOUNTER — Telehealth: Admitting: Psychiatry

## 2024-01-26 DIAGNOSIS — F411 Generalized anxiety disorder: Secondary | ICD-10-CM

## 2024-01-26 DIAGNOSIS — F331 Major depressive disorder, recurrent, moderate: Secondary | ICD-10-CM

## 2024-01-26 NOTE — Progress Notes (Signed)
 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  Discussed telemedicine policy with patient.  Patient agreeable to rescheduling this appointment.  Advised staff to contact patient to reschedule this appointment.

## 2024-01-29 ENCOUNTER — Ambulatory Visit (HOSPITAL_BASED_OUTPATIENT_CLINIC_OR_DEPARTMENT_OTHER): Admitting: Pulmonary Disease

## 2024-02-06 ENCOUNTER — Encounter (HOSPITAL_COMMUNITY)

## 2024-02-14 ENCOUNTER — Ambulatory Visit (HOSPITAL_COMMUNITY)
Admission: RE | Admit: 2024-02-14 | Discharge: 2024-02-14 | Disposition: A | Discharge status: Home / Self Care | Source: Ambulatory Visit | Attending: Cardiology | Admitting: Cardiology

## 2024-02-14 ENCOUNTER — Other Ambulatory Visit (HOSPITAL_BASED_OUTPATIENT_CLINIC_OR_DEPARTMENT_OTHER)

## 2024-02-14 ENCOUNTER — Encounter: Payer: Self-pay | Admitting: Psychiatry

## 2024-02-14 ENCOUNTER — Telehealth (INDEPENDENT_AMBULATORY_CARE_PROVIDER_SITE_OTHER): Admitting: Psychiatry

## 2024-02-14 DIAGNOSIS — F331 Major depressive disorder, recurrent, moderate: Secondary | ICD-10-CM | POA: Diagnosis not present

## 2024-02-14 DIAGNOSIS — R0609 Other forms of dyspnea: Secondary | ICD-10-CM | POA: Diagnosis present

## 2024-02-14 DIAGNOSIS — F411 Generalized anxiety disorder: Secondary | ICD-10-CM

## 2024-02-14 LAB — ECHOCARDIOGRAM COMPLETE
Area-P 1/2: 3.73 cm2
S' Lateral: 2.7 cm

## 2024-02-14 MED ORDER — LORAZEPAM 0.5 MG PO TABS
0.2500 mg | ORAL_TABLET | ORAL | 0 refills | Status: AC
Start: 2024-02-21 — End: 2024-03-22

## 2024-02-14 NOTE — Progress Notes (Addendum)
 Virtual Visit via Video Note  I connected with Melissa Black on 02/14/24 at  9:30 AM EDT by a video enabled telemedicine application and verified that I am speaking with the correct person using two identifiers.  Location Provider Location : [facility] Patient Location : Home  Participants: Patient , Provider   I discussed the limitations of evaluation and management by telemedicine and the availability of in person appointments. The patient expressed understanding and agreed to proceed.   I discussed the assessment and treatment plan with the patient. The patient was provided an opportunity to ask questions and all were answered. The patient agreed with the plan and demonstrated an understanding of the instructions.   The patient was advised to call back or seek an in-person evaluation if the symptoms worsen or if the condition fails to improve as anticipated.   BH MD OP Progress Note  02/14/2024 10:55 AM Melissa Black  MRN:  [id number]  Chief Complaint:  Chief Complaint  Patient presents with   Follow-up   Anxiety   Depression   Medication Refill   Discussed the use of AI scribe software for clinical note transcription with the patient, who gave verbal consent to proceed.  History of Present Illness Melissa Black is a 43 year old African-American female, lives in [place], divorced, currently on Social Security disability, has a history of depression, anxiety, insomnia was evaluated by telemedicine today.  Ongoing symptoms of depression, including persistent low mood and irritability, continue to affect her. She notes that amitriptyline  25 mg at night improved her sleep but increased her irritability and anger, and she feels it did not improve her depression. After she previously discontinued amitriptyline , she restarted it because she still had medication available, but she plans to stop it again due to side effects. Concerns about medications worsening her  breathing issues have made her hesitant to start new medications.  Episodes of anxiety and possible panic attacks, characterized by severe shortness of breath and difficulty calming herself, persist. She estimates experiencing these episodes approximately 4 to 5 times in the past month, with 2 to 3 episodes in the past week. For severe episodes, she uses lorazepam  as needed, typically taking a half or whole tablet, and estimates using it about twice per week. She also uses cyclobenzaprine  as needed for muscle relaxation during these episodes, sometimes up to twice daily, and finds that lorazepam  and cyclobenzaprine  together help her calm down during acute attacks. Concern about running out of lorazepam  remains, as she feels it is the only intervention that reliably helps her during severe episodes. She has had multiple hospital visits since July as a result of her breathing episodes, and she keeps a detailed log of her medication use and episodes.  She maintains good appetite and adequate food intake. She denies any thoughts of hurting herself or others. She denies auditory or visual hallucinations. She is not currently working with a therapist. Her current medication regimen includes amitriptyline  25 mg at night (with plans to discontinue), lorazepam  as needed (approximately twice per week), cyclobenzaprine  as needed for muscle relaxation.She also takes multivitamins and receives Depo Provera injections. She is not taking gabapentin , hydroxyzine , or other inhalers at this time. She remains concerned that her breathing issues and reports that her primary, cardiology and lung doctors have performed multiple tests but have not found a cause according to her understanding.  She reports past cigarette smoking and quit 4 years ago.  She also quit vaping.  She denies  current use of cannabis, cigarettes, or vaping products.  She lives with her daughter and mother. She reports having a social support  system.     Visit Diagnosis: R/O Panic disorder   ICD-10-CM   1. MDD (major depressive disorder), recurrent episode, moderate (HCC)  F33.1     2. GAD (generalized anxiety disorder)  F41.1 LORazepam  (ATIVAN ) 0.5 MG tablet      Past Psychiatric History: I have reviewed past psychiatric history from progress note on 07/31/2023.  Past trials of medications like amitriptyline , duloxetine , Lexapro .  Past Medical History:  Past Medical History:  Diagnosis Date   ADHD (attention deficit hyperactivity disorder)    Anemia    h/o   Anxiety    Asthma    Chest pain, unspecified    Genital herpes    History of chlamydia    History of gonorrhea    History of marijuana use    Obesity    SOB (shortness of breath)    Spinal stenosis of lumbar region with radiculopathy     Past Surgical History:  Procedure Laterality Date   CESAREAN SECTION     x1   FOOT SURGERY Right    broken toe   HEMI-MICRODISCECTOMY LUMBAR LAMINECTOMY LEVEL 1 Right 11/10/2021   Procedure: OPEN RIGHT REDO L5-S1 MICRODISCECTOMY;  Surgeon: Clois Fret, MD;  Location: [facility];  Service: Neurosurgery;  Laterality: Right;   LUMBAR LAMINECTOMY/DECOMPRESSION MICRODISCECTOMY Right 08/30/2021   Procedure: RIGHT L4-5 LAMINOFORAMINOTOMY, RIGHT L5-S1 MICRODISCECTOMY;  Surgeon: Clois Fret, MD;  Location: [facility] OR[facility];  Service: Neurosurgery;  Laterality: Right;   THYROID  SURGERY  12/28/2023    Family Psychiatric History: I have reviewed family psychiatric history from progress note on 07/31/2023.  Family History:  Family History  Problem Relation Age of Onset   Depression Mother    ADD / ADHD Son     Social History: I have reviewed social history from progress note on 07/31/2023. Social History   Socioeconomic History   Marital status: Divorced    Spouse name: Not on file   Number of children: 2   Years of education: Not on file   Highest education level: High school graduate  Occupational History   Not on  file  Tobacco Use   Smoking status: Former    Current packs/day: 0.00    Average packs/day: 0.5 packs/day for 20.0 years (10.0 ttl pk-yrs)    Types: Cigarettes    Start date: 2002    Quit date: 2022    Years since quitting: 3.8   Smokeless tobacco: Never  Vaping Use   Vaping status: Every Day   Substances: Flavoring  Substance and Sexual Activity   Alcohol use: No   Drug use: Not Currently    Types: Marijuana   Sexual activity: Yes  Other Topics Concern   Not on file  Social History Narrative   Lives with boyfriend and daughter   Social Drivers of Corporate investment banker Strain: Not on file  Food Insecurity: No Food Insecurity (12/15/2023)   Received from [facility]   Hunger Vital Sign    Within the past 12 months, you worried that your food would run out before you got the money to buy more.: Never true    Within the past 12 months, the food you bought just didn't last and you didn't have money to get more.: Never true  Transportation Needs: No Transportation Needs (12/15/2023)   Received from [facility] - [facility]  Lack of Transportation (Medical): No    Lack of Transportation (Non-Medical): No  Physical Activity: Not on file  Stress: Not on file  Social Connections: Not on file    Allergies:  Allergies  Allergen Reactions   Codeine Swelling   Penicillins Swelling and Other (See Comments)    TOLERATED CEFAZOLIN  Has patient had a PCN reaction causing immediate rash, facial/tongue/throat swelling, SOB or lightheadedness with hypotension: yes Has patient had a PCN reaction causing severe rash involving mucus membranes or skin necrosis: no Has patient had a PCN reaction that required hospitalization no Has patient had a PCN reaction occurring within the last 10 years: no If all of the above answers are NO, then may proceed with Cephalosporin use.    Benadryl [Diphenhydramine] Hives   Bupropion Palpitations    Heart palpitations.     Metabolic Disorder Labs: No results found for: HGBA1C, MPG No results found for: PROLACTIN No results found for: CHOL, TRIG, HDL, CHOLHDL, VLDL, LDLCALC Lab Results  Component Value Date   TSH 2.914 11/24/2023    Therapeutic Level Labs: No results found for: LITHIUM No results found for: VALPROATE No results found for: CBMZ  Current Medications: Current Outpatient Medications  Medication Sig Dispense Refill   albuterol  (VENTOLIN  HFA) 108 (90 Base) MCG/ACT inhaler Inhale 2 puffs into the lungs every 6 (six) hours as needed for wheezing or shortness of breath. 8 g 2   cyclobenzaprine  (FLEXERIL ) 10 MG tablet Take 1 tablet (10 mg total) by mouth 3 (three) times daily as needed for up to 20 days. 60 tablet 0   medroxyPROGESTERone (DEPO-PROVERA) 150 MG/ML injection Inject into the muscle.     Multiple Vitamin (MULTIVITAMIN WITH MINERALS) TABS tablet Take 1 tablet by mouth in the morning. One A Day Multivitamin for Women     hydrOXYzine  (ATARAX ) 10 MG tablet Take 1 tablet (10 mg total) by mouth 3 (three) times daily as needed for anxiety. (Patient not taking: Reported on 02/14/2024)     [START ON 02/21/2024] LORazepam  (ATIVAN ) 0.5 MG tablet Take 0.5-1 tablets (0.25-0.5 mg total) by mouth as directed. Take half to one tablet daily as needed for severe anxiety attacks only , must last 30 days. 12 tablet 0   mirtazapine  (REMERON ) 7.5 MG tablet TAKE 1 TABLET BY MOUTH AT BEDTIME. (Patient not taking: Reported on 02/14/2024) 90 tablet 0   No current facility-administered medications for this visit.     Musculoskeletal: Strength & Muscle Tone: UTA Gait & Station: Seated Patient leans: N/A  Psychiatric Specialty Exam: Review of Systems  Psychiatric/Behavioral:  Positive for dysphoric mood and sleep disturbance. The patient is nervous/anxious.     Last menstrual period 12/22/2023.There is no height or weight on file to calculate BMI.  General Appearance: Casual   Eye Contact:  Good  Speech:  Clear and Coherent  Volume:  Normal  Mood:  Anxious and Depressed  Affect:  Congruent  Thought Process:  Goal Directed and Descriptions of Associations: Intact  Orientation:  Full (Time, Place, and Person)  Thought Content: Logical   Suicidal Thoughts:  No  Homicidal Thoughts:  No  Memory:  Immediate;   Fair Recent;   Fair Remote;   Fair  Judgement:  Fair  Insight:  Shallow  Psychomotor Activity:  Normal  Concentration:  Concentration: Fair and Attention Span: Fair  Recall:  [employer] of Knowledge: Fair  Language: Fair  Akathisia:  No  Handed:  Right  AIMS (if indicated): not done  Assets:  Communication Skills Desire for Improvement Housing Social Support Transportation  ADL's:  Intact  Cognition: WNL  Sleep:  fair on medication   Screenings: GAD-7    Flowsheet Row Office Visit from [facility]/31/2023 in [facility]  Total GAD-7 Score 15   PHQ2-9    Flowsheet Row Office Visit from 01/03/2024 in [facility] Office Visit from 07/31/2023 in [facility]  PHQ-2 Total Score 2 5  PHQ-9 Total Score 6 16   Flowsheet Row Office Visit from 01/03/2024 in [facility] from 11/24/2023 in [facility] ED from 11/17/2023 in [facility] at [facility]  C-SSRS RISK CATEGORY No Risk No Risk No Risk     Assessment and Plan: Phynix Horton is a 43 year old African-American female who has a history of depression, anxiety, insomnia, chronic pain was evaluated by telemedicine today.  Discussed assessment and plan as noted below.  1. MDD (major depressive disorder), recurrent episode, moderate (HCC)-unstable Patient has been noncompliant with mirtazapine  which was prescribed and went back on amitriptyline  which she had previously  discontinued due to side effects.  She reports the amitriptyline  is beneficial for her sleep although it does not help with her mood symptoms.  Interested in trial of mirtazapine  since she continues to struggle with mood symptoms. Start mirtazapine  7.5 mg at bedtime. Provided medication education including risk for allergic reaction and other side effects.  Patient to get immediate help if she has any side effects.   2. GAD (generalized anxiety disorder)-unstable With ongoing anxiety as well as anxiety attacks, dyspnea on exertion status post thyroid  surgery.  She reports she continues to have these episodes of shortness of breath and continues to work with her providers who cannot find a cause for her anxiety attacks.  Patient is not compliant with psychotherapy, this was discussed with her last visit.  Patient continues to be resistant stating previous therapy did not work.  She has believes these episodes are not panic attacks since married of the techniques that they taught her works when she has these attacks.  She continues to want to use lorazepam  which she is using when she has severe episodes.  Patient provided education about long-term benzodiazepine use and the risk of habit-forming potential, tolerance. Patient encouraged to establish care with therapist. Patient to follow-up with her providers as scheduled. Continue lorazepam  0.5 mg as needed for severe anxiety attacks only.  Provided a short supply. Reviewed [place] PMP AWARxE Patient referred for sleep study previously-pending.  Patient with history of snoring. Urine drug screen reviewed-01/19/2024-negative for drugs tested.   Rule out panic disorder-will evaluate patient in future sessions.  Patient was evaluated by cardiology Dr. Dorn Dames dated 01/02/2024-patient was referred by pulmonologist for dyspnea on exertion.  Patient reported complaining of significant shortness of breath beginning after she sprayed her house with a bug spray  11/17/2023.  Her workup has been unrevealing including a chest CT done at [facility] 11/21/2023 that was essentially normal.  A 2D echo was ordered.  Follow-up Follow-up in clinic in 3 weeks or sooner in person.   Collaboration of Care: Collaboration of Care: Referral or follow-up with counselor/therapist AEB patient encouraged to establish care with a therapist for her anxiety/depression as well as possible panic attacks.  Patient/Guardian was advised Release of Information must be obtained prior to any record release in order to collaborate their care  with an outside provider. Patient/Guardian was advised if they have not already done so to contact the registration department to sign all necessary forms in order for us  to release information regarding their care.   Consent: Patient/Guardian gives verbal consent for treatment and assignment of benefits for services provided during this visit. Patient/Guardian expressed understanding and agreed to proceed.  This note was generated in part or whole with voice recognition software. Voice recognition is usually quite accurate but there are transcription errors that can and very often do occur. I apologize for any typographical errors that were not detected and corrected.     Melissa Pinnix, MD 02/14/2024, 10:55 AM

## 2024-02-14 NOTE — Patient Instructions (Signed)
 [email or web address]  [email or web address]  [email or web address] [facility] [address], [place], [place] [address]   [phone]  [facility] [email or web address] [address] [place], [place] [address]  [phone]  [facility] [email or web address] [address], [place], [place] [address]  [phone]   [facility] - [phone]  [facility] - [phone]  [facility] counseling - [phone] counseling [phone]  [facility] - [phone]   [facility] [address] [place], [place] [address]  [phone]    Belvie Chancy [address] [place], [place] [address]  [phone]    [facility] [address] [place], [place] [address]  [phone]   Mirtazapine  Tablets What is this medication? MIRTAZAPINE  (mir TAZ a peen) treats depression. It increases the amount of serotonin and norepinephrine in the brain, substances that help regulate mood. This medicine may be used for other purposes; ask your health care provider or pharmacist if you have questions. COMMON BRAND NAME(S): Remeron  What should I tell my care team before I take this medication? They need to know if you have any of these conditions: Bipolar disorder Dehydration Glaucoma Have had a stroke Heart or blood vessel conditions Kidney disease Liver disease Low blood pressure Low levels of sodium in the blood Low white blood cell levels Seizures Suicidal thoughts, plans, or attempt An unusual or allergic reaction to mirtazapine , other medications, foods, dyes, or preservatives Pregnant or trying to get pregnant Breastfeeding How should I use this medication? Take this medication by mouth with water. Take it as directed on the prescription label at the  same time every day. You can take it with or without food. If it upsets your stomach, take it with food. Keep taking this medication unless your care team tells you to stop. Stopping it too quickly can cause serious side effects. It can also make your condition worse. A special MedGuide will be given to you by the pharmacist with each prescription and refill. Be sure to read this information carefully each time. Talk to your care team about the use of this medication in children. Special care may be needed. Overdosage: If you think you have taken too much of this medicine contact a poison control center or emergency room at once. NOTE: This medicine is only for you. Do not share this medicine with others. What if I miss a dose? If you miss a dose, take it as soon as you can. If it is almost time for your next dose, take only that dose. Do not take double or extra doses. What may interact with this medication? Do not take this medication with any of the following: Cisapride Dronedarone Levoketoconazole Linezolid MAOIs, such as Marplan, Nardil, and Parnate Methylene blue Pimozide Some medications for fungal infections, such as ketoconazole, posaconazole, voriconazole Thioridazine This medication may also interact with the following: Alcohol Benzodiazepines, such as lorazepam  Cimetidine Clarithromycin Other medications that cause heart rhythm changes Opioids Rifampin Some medications for depression, anxiety, or other mental health conditions Some medication for migraines, such as sumatriptan Some medications for seizures, such as carbamazepine or phenytoin Stimulant medications for ADHD, weight loss, or staying awake Supplements, such as St. John's wort or tryptophan Warfarin Other medications may affect the way this medication works. Talk with your care team about all the medications you take. They may  suggest changes to your treatment plan to lower the risk of side effects and to make  sure your medications work as intended. This list may not describe all possible interactions. Give your health care provider a list of all the medicines, herbs, non-prescription drugs, or dietary supplements you use. Also tell them if you smoke, drink alcohol, or use illegal drugs. Some items may interact with your medicine. What should I watch for while using this medication? Visit your care team for regular checks on your progress. It may be some time before you see the benefit from this medication. This medication may worsen depression and cause thoughts of suicide. This can happen at any time but is more common after first starting treatment and after a change in dose. Talk to your care team right away if you have changes in mood and behavior or thoughts of self-harm or suicide. They can help you. This medication may affect your coordination, reaction time, or judgment. Do not drive or operate machinery until you know how this medication affects you. Sit up or stand slowly to reduce the risk of dizzy or fainting spells. Drinking alcohol with this medication can increase the risk of these side effects. Your mouth may get dry. Chewing sugarless gum or sucking hard candy and drinking plenty of water may help. Contact your care team if the problem does not go away or is severe. What side effects may I notice from receiving this medication? Side effects that you should report to your care team as soon as possible: Allergic reactions--skin rash, itching, hives, swelling of the face, lips, tongue, or throat Heart rhythm changes--fast or irregular heartbeat, dizziness, feeling faint or lightheaded, chest pain, trouble breathing Infection--fever, chills, cough, or sore throat Irritability, confusion, fast or irregular heartbeat, muscle stiffness, twitching muscles, sweating, high fever, seizure, chills, vomiting, diarrhea, which may be signs of serotonin syndrome Low sodium level--muscle weakness, fatigue,  dizziness, headache, confusion Rash, fever, and swollen lymph nodes Redness, blistering, peeling, or loosening of the skin, including inside the mouth Seizures Sudden eye pain or change in vision such as blurry vision, seeing halos around lights, vision loss Thoughts of suicide or self-harm, worsening mood, feelings of depression Side effects that usually do not require medical attention (report these to your care team if they continue or are bothersome): Constipation Dizziness Drowsiness Dry mouth Increase in appetite Weight gain This list may not describe all possible side effects. Call your doctor for medical advice about side effects. You may report side effects to FDA at 1-800-FDA-1088. Where should I keep my medication? Keep out of the reach of children and pets. Store at room temperature between 20 and 25 degrees C (68 and 77 degrees F). Protect from light and moisture. Keep the container tightly closed. Get rid of any unused medication after the expiration date. To get rid of medications that are no longer needed or have expired: Take the medication to a take-back program. Check with your pharmacy or law enforcement to find a location. If you cannot return the medication, check the label or package insert to see if the medication should be thrown out in the garbage or flushed down the toilet. If you are not sure, ask your care team. If it is safe to put it in the trash, empty the medication out of the container. Mix it with cat litter, dirt, coffee grounds, or another unwanted substance. Seal the mixture in a bag or container. Put it in the trash. NOTE: This sheet is  a summary. It may not cover all possible information. If you have questions about this medicine, talk to your doctor, pharmacist, or health care provider.  2025 Elsevier/Gold Standard (2023-08-09 00:00:00)

## 2024-02-15 ENCOUNTER — Ambulatory Visit: Payer: Self-pay | Admitting: Pulmonary Disease

## 2024-02-20 ENCOUNTER — Encounter (HOSPITAL_COMMUNITY)

## 2024-02-22 IMAGING — RF DG LUMBAR SPINE 1V
1 series · 2 of 2 positions shown · non-contrast
Comparison: MRI lumbar spine June 22, 2021

CLINICAL DATA: L4-L5 lamina oophorectomy, L5-S1 discectomy.

EXAM:
LUMBAR SPINE - 1 VIEW

[Series 1: dg x-ray · 0.20mm/px · 2 of 2 slices shown]
[im 1/2]
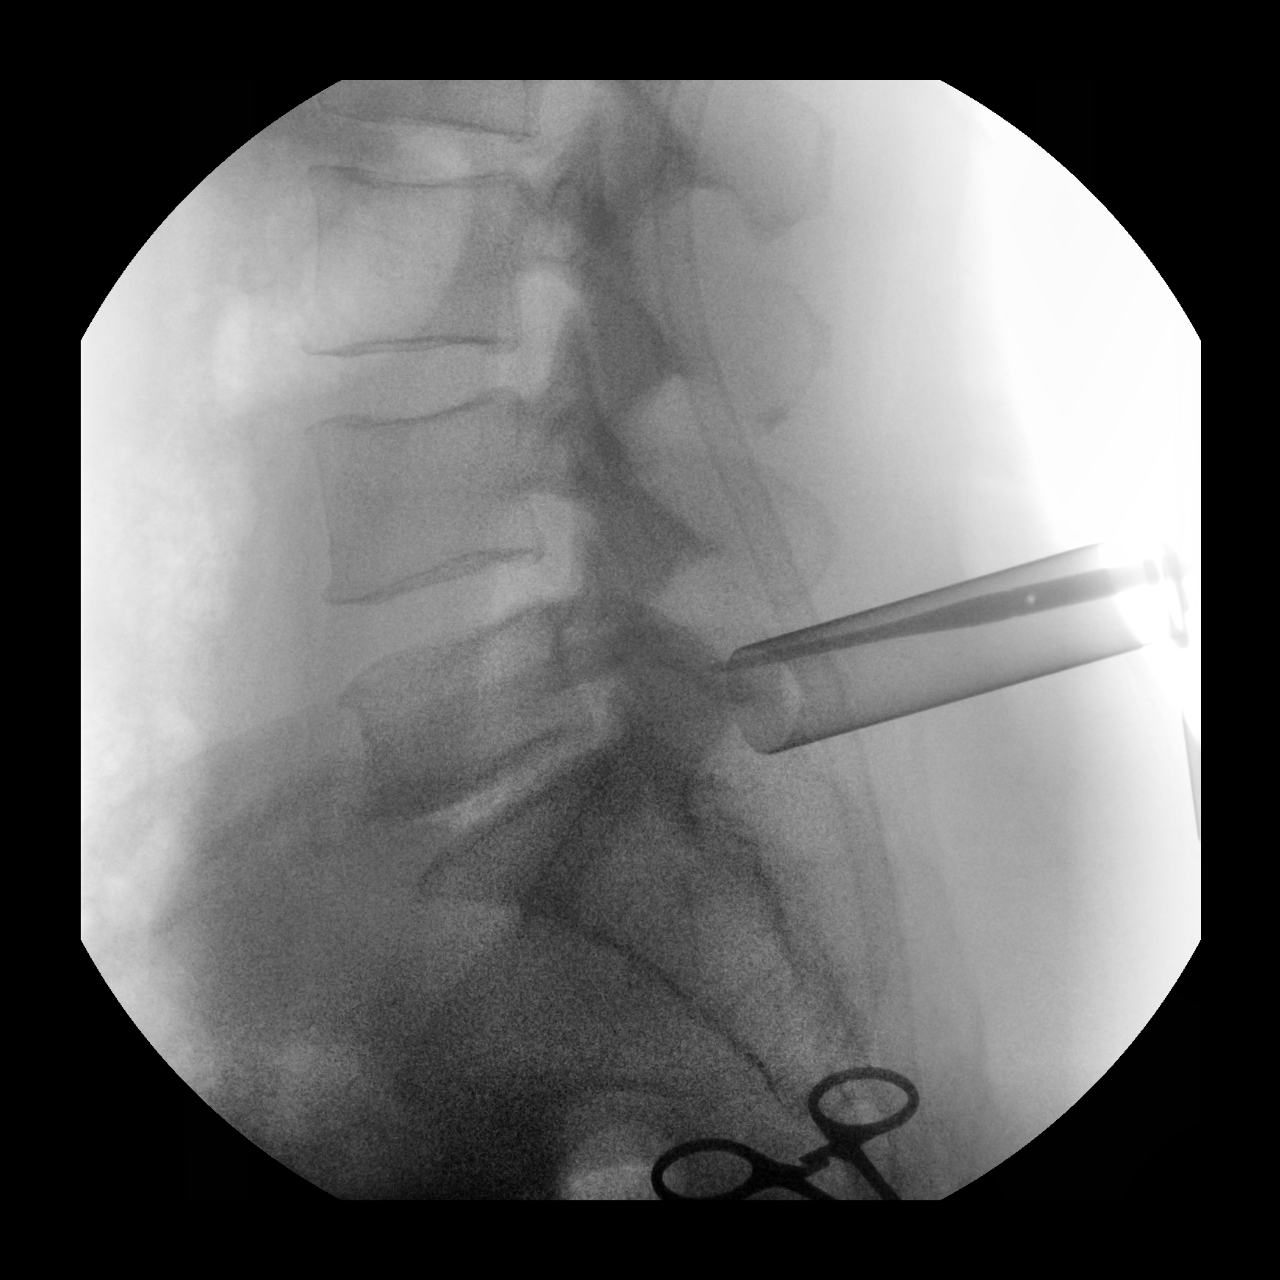
[im 2/2]
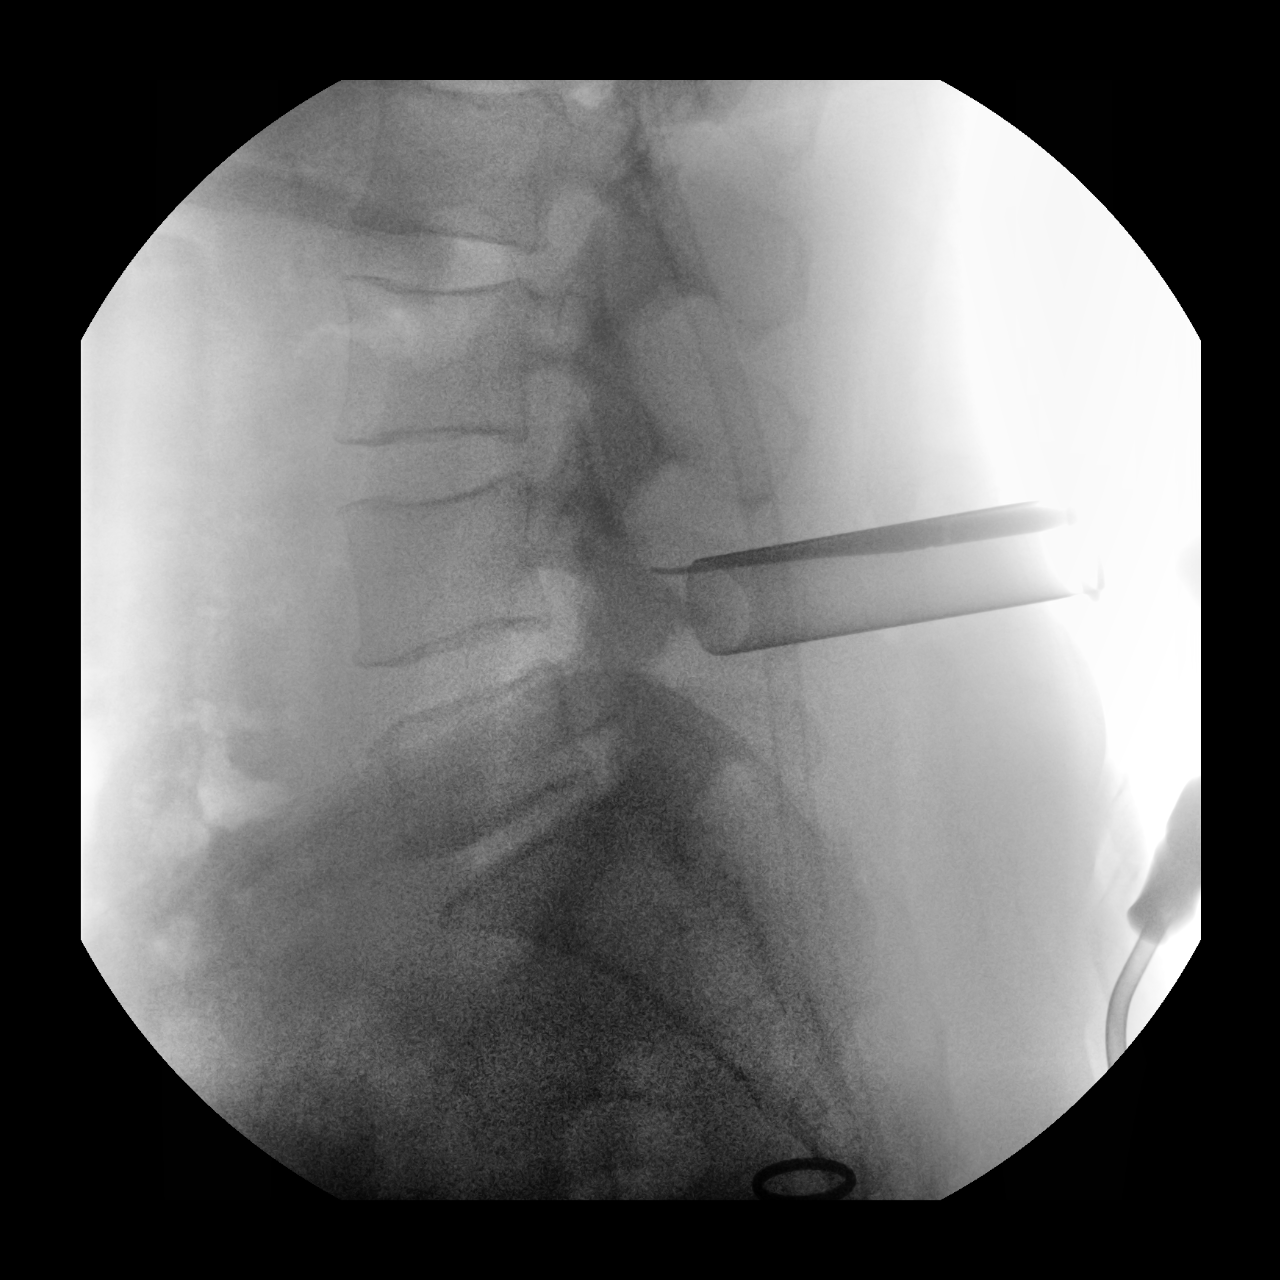

[2 of 2 positions shown; findings below may reference images not displayed]

FINDINGS: Two C-arm fluoroscopic images were obtained intraoperatively and
submitted for post operative interpretation. These images
demonstrate findings associated with laminectomy/discectomy. Normal
alignment. Please see the performing provider's procedural report
for further detail.
IMPRESSION: Intraoperative imaging of lumbosacral laminectomy/discectomy.

## 2024-03-04 ENCOUNTER — Ambulatory Visit: Admitting: Psychiatry

## 2024-03-05 ENCOUNTER — Encounter: Payer: Self-pay | Admitting: Psychiatry

## 2024-03-05 ENCOUNTER — Ambulatory Visit (INDEPENDENT_AMBULATORY_CARE_PROVIDER_SITE_OTHER): Admitting: Psychiatry

## 2024-03-05 VITALS — BP 110/72 | HR 100 | Temp 98.2°F | Ht 67.0 in | Wt 215.0 lb

## 2024-03-05 DIAGNOSIS — F411 Generalized anxiety disorder: Secondary | ICD-10-CM | POA: Diagnosis not present

## 2024-03-05 DIAGNOSIS — F3341 Major depressive disorder, recurrent, in partial remission: Secondary | ICD-10-CM

## 2024-03-05 MED ORDER — MIRTAZAPINE 15 MG PO TABS: 15.0000 mg | ORAL_TABLET | Freq: Every day | ORAL | 1 refills | Status: DC

## 2024-03-05 MED ORDER — HYDROXYZINE HCL 25 MG PO TABS: 12.5000 mg | ORAL_TABLET | Freq: Three times a day (TID) | ORAL | 0 refills | Status: DC | PRN

## 2024-03-05 NOTE — Progress Notes (Unsigned)
 [facility] MD OP Progress Note  03/05/2024 3:47 PM Franchesca Veneziano  MRN:  [id number]  Chief Complaint:  Chief Complaint  Patient presents with   Follow-up   Anxiety   Medication Refill   History of presenting illness:Melissa Black is a 43 year old African-American female, lives in [place], divorced, currently on Social Security disability, has a history of depression, anxiety, insomnia was evaluated in office today for a follow-up appointment.  She appeared to be cheerful and pleasant in session today.  She reports she does not feel sad like she used to before, depression definitely has been improving.  She also has not had any significant anxiety or panic attacks recently.  The last time she had to take lorazepam  to help with panic symptoms was a month ago.  She barely uses the lorazepam  which was prescribed to her last visit.  She continues to struggle with sleep.  She started taking the mirtazapine  which was prescribed last visit.  Although it helps with her mood symptoms she does not feel it helps much with her sleep at this dosage.  She does have vivid dreams and nightmares.  She reports appetite is fair.  She however likely is gaining weight and is concerned about that.  Weight gain likely also due to the fact that she is not very active and currently not working out.  She denies any suicidality, homicidality or perceptual disturbances.  She is scheduled to see a therapist to start psychotherapy soon at [facility].   Visit Diagnosis: Rule out panic disorder   ICD-10-CM   1. Recurrent major depressive disorder, in partial remission  F33.41 mirtazapine  (REMERON ) 15 MG tablet    2. GAD (generalized anxiety disorder)  F41.1 mirtazapine  (REMERON ) 15 MG tablet    hydrOXYzine  (ATARAX ) 25 MG tablet      Past Psychiatric History: I have reviewed past psychiatric history from progress note on 07/31/2023.  Past trials of medications like amitriptyline , duloxetine ,  Lexapro .  Past Medical History:  Past Medical History:  Diagnosis Date   ADHD (attention deficit hyperactivity disorder)    Anemia    h/o   Anxiety    Asthma    Chest pain, unspecified    Genital herpes    History of chlamydia    History of gonorrhea    History of marijuana use    Obesity    SOB (shortness of breath)    Spinal stenosis of lumbar region with radiculopathy     Past Surgical History:  Procedure Laterality Date   CESAREAN SECTION     x1   FOOT SURGERY Right    broken toe   HEMI-MICRODISCECTOMY LUMBAR LAMINECTOMY LEVEL 1 Right 11/10/2021   Procedure: OPEN RIGHT REDO L5-S1 MICRODISCECTOMY;  Surgeon: Clois Fret, MD;  Location: [facility] OR[facility];  Service: Neurosurgery;  Laterality: Right;   LUMBAR LAMINECTOMY/DECOMPRESSION MICRODISCECTOMY Right 08/30/2021   Procedure: RIGHT L4-5 LAMINOFORAMINOTOMY, RIGHT L5-S1 MICRODISCECTOMY;  Surgeon: Clois Fret, MD;  Location: [facility];  Service: Neurosurgery;  Laterality: Right;   THYROID  SURGERY  12/28/2023    Family Psychiatric History: I have reviewed family psychiatric history from progress note on 07/31/2023.  Family History:  Family History  Problem Relation Age of Onset   Depression Mother    ADD / ADHD Son     Social History: I have reviewed social history from progress note on 07/31/2023. Social History   Socioeconomic History   Marital status: Divorced    Spouse name: Not on file  Number of children: 2   Years of education: Not on file   Highest education level: High school graduate  Occupational History   Not on file  Tobacco Use   Smoking status: Former    Current packs/day: 0.00    Average packs/day: 0.5 packs/day for 20.0 years (10.0 ttl pk-yrs)    Types: Cigarettes    Start date: 2002    Quit date: 2022    Years since quitting: 3.8   Smokeless tobacco: Never   Tobacco comments:    Stopped vaping approximately 4 months back  Vaping Use   Vaping status: Every Day   Substances: Flavoring   Substance and Sexual Activity   Alcohol use: Not Currently   Drug use: Not Currently    Types: Marijuana   Sexual activity: Yes  Other Topics Concern   Not on file  Social History Narrative   Lives with boyfriend and daughter   Social Drivers of Corporate Investment Banker Strain: Not on file  Food Insecurity: No Food Insecurity (12/15/2023)   Received from [facility]   Hunger Vital Sign    Within the past 12 months, you worried that your food would run out before you got the money to buy more.: Never true    Within the past 12 months, the food you bought just didn't last and you didn't have money to get more.: Never true  Transportation Needs: No Transportation Needs (12/15/2023)   Received from [facility]   PRAPARE - Transportation    Lack of Transportation (Medical): No    Lack of Transportation (Non-Medical): No  Physical Activity: Not on file  Stress: Not on file  Social Connections: Not on file    Allergies:  Allergies  Allergen Reactions   Codeine Swelling   Penicillins Swelling and Other (See Comments)    TOLERATED CEFAZOLIN  Has patient had a PCN reaction causing immediate rash, facial/tongue/throat swelling, SOB or lightheadedness with hypotension: yes Has patient had a PCN reaction causing severe rash involving mucus membranes or skin necrosis: no Has patient had a PCN reaction that required hospitalization no Has patient had a PCN reaction occurring within the last 10 years: no If all of the above answers are NO, then may proceed with Cephalosporin use.    Benadryl [Diphenhydramine] Hives   Bupropion Palpitations    Heart palpitations.    Metabolic Disorder Labs: No results found for: HGBA1C, MPG No results found for: PROLACTIN No results found for: CHOL, TRIG, HDL, CHOLHDL, VLDL, LDLCALC Lab Results  Component Value Date   TSH 2.914 11/24/2023    Therapeutic Level Labs: No results found for: LITHIUM No results found  for: VALPROATE No results found for: CBMZ  Current Medications: Current Outpatient Medications  Medication Sig Dispense Refill   albuterol  (VENTOLIN  HFA) 108 (90 Base) MCG/ACT inhaler Inhale 2 puffs into the lungs every 6 (six) hours as needed for wheezing or shortness of breath. 8 g 2   cyclobenzaprine  (FLEXERIL ) 10 MG tablet Take 1 tablet (10 mg total) by mouth 3 (three) times daily as needed for up to 20 days. 60 tablet 0   hydrOXYzine  (ATARAX ) 25 MG tablet Take 0.5-1 tablets (12.5-25 mg total) by mouth 3 (three) times daily as needed for anxiety. And sleep 90 tablet 0   LORazepam  (ATIVAN ) 0.5 MG tablet Take 0.5-1 tablets (0.25-0.5 mg total) by mouth as directed. Take half to one tablet daily as needed for severe anxiety attacks only , must last 30 days. 12  tablet 0   medroxyPROGESTERone (DEPO-PROVERA) 150 MG/ML injection Inject into the muscle.     mirtazapine  (REMERON ) 15 MG tablet Take 1 tablet (15 mg total) by mouth at bedtime. 30 tablet 1   Multiple Vitamin (MULTIVITAMIN WITH MINERALS) TABS tablet Take 1 tablet by mouth in the morning. One A Day Multivitamin for Women     No current facility-administered medications for this visit.     Musculoskeletal: Strength & Muscle Tone: within normal limits Gait & Station: normal Patient leans: N/A  Psychiatric Specialty Exam: Review of Systems  Psychiatric/Behavioral:  Positive for sleep disturbance. The patient is nervous/anxious.     Blood pressure 110/72, pulse 100, temperature 98.2 F (36.8 C), temperature source Temporal, height 5' 7 (1.702 m), weight 215 lb (97.5 kg), SpO2 100%.Body mass index is 33.67 kg/m.  General Appearance: Casual  Eye Contact:  Fair  Speech:  Clear and Coherent  Volume:  Normal  Mood:  Anxious  Affect:  Full Range  Thought Process:  Goal Directed and Descriptions of Associations: Intact  Orientation:  Full (Time, Place, and Person)  Thought Content: Logical   Suicidal Thoughts:  No  Homicidal  Thoughts:  No  Memory:  Immediate;   Fair Recent;   Fair Remote;   Fair  Judgement:  Fair  Insight:  Fair  Psychomotor Activity:  Normal  Concentration:  Concentration: Fair and Attention Span: Fair  Recall:  [employer] of Knowledge: Fair  Language: Fair  Akathisia:  No  Handed:  Right  AIMS (if indicated): not done  Assets:  Communication Skills Desire for Improvement Housing Social Support Transportation  ADL's:  Intact  Cognition: WNL  Sleep:  Poor   Screenings: GAD-7    Loss Adjuster, Chartered Office Visit from 03/05/2024 in [facility] Office Visit from 07/31/2023 in [facility]  Total GAD-7 Score 5 15   PHQ2-9    Flowsheet Row Office Visit from 03/05/2024 in [facility] Office Visit from 01/03/2024 in [facility] Office Visit from 07/31/2023 in [facility]  PHQ-2 Total Score 2 2 5   PHQ-9 Total Score 7 6 16    Flowsheet Row Office Visit from 03/05/2024 in [facility] Video Visit from 02/14/2024 in [facility] Office Visit from 01/03/2024 in [facility]  C-SSRS RISK CATEGORY No Risk No Risk No Risk     Assessment and Plan: Noelie Renfrow is a 43 year old African-American female who has a history of depression, anxiety, insomnia, chronic pain was evaluated in office today.  Discussed assessment and plan as noted below.  1. Recurrent major depressive disorder, in partial remission Currently reports mood symptoms is improving.  Continues to struggle with sleep.  Although does have nightmares and vivid dreams interested in dosage increase of mirtazapine . Increase Mirtazapine  to 15 mg at bedtime  2. GAD (generalized anxiety disorder)-improving Reports anxiety  has improved and denies any recent panic attacks. Increase Mirtazapine  to 15 mg at bedtime Add Hydroxyzine  12.5-25 mg 3 times a day as needed for anxiety and sleep Continue Lorazepam  0.5 mg as needed for severe anxiety attacks only,rarely uses it. Will consider repeating urine drug screen in the future. Reviewed [place] PMP AWARxE  Follow-up Follow-up in clinic in 3 weeks or sooner in person.   Collaboration of Care: Collaboration of Care: Referral or follow-up with counselor/therapist AEB  encouraged to establish care with therapist. Patient currently also on a wait list with our therapist.  I have reviewed notes per Dr.Alghanim dated 01/23/2024-patient with history of centrilobular emphysema history of smoking and vaping.  Multiple CT chest imaging shows mild upper lobe emphysema.  PFTs without evidence of obstruction.  Dyspnea on exertion-unclear etiology at this time.  PFTs relatively normal and does not explain her dyspnea.  She was advised to schedule an appointment with a therapist in the setting of frequent panic attacks to start CBT.  Patient also had an echocardiogram dated 02/15/2024-which was within normal limits.  Patient/Guardian was advised Release of Information must be obtained prior to any record release in order to collaborate their care with an outside provider. Patient/Guardian was advised if they have not already done so to contact the registration department to sign all necessary forms in order for us  to release information regarding their care.   Consent: Patient/Guardian gives verbal consent for treatment and assignment of benefits for services provided during this visit. Patient/Guardian expressed understanding and agreed to proceed.   This note was generated in part or whole with voice recognition software. Voice recognition is usually quite accurate but there are transcription errors that can and very often do occur. I apologize for any typographical errors that were not  detected and corrected.    Bentley Fissel, MD 03/06/2024, 7:46 AM

## 2024-03-05 NOTE — Patient Instructions (Signed)
 Hydroxyzine Capsules or Tablets What is this medication? HYDROXYZINE (hye DROX i zeen) treats the symptoms of allergies and allergic reactions. It may also be used to treat anxiety or cause drowsiness before a procedure. It works by blocking histamine, a substance released by the body during an allergic reaction. It belongs to a group of medications called antihistamines. This medicine may be used for other purposes; ask your health care provider or pharmacist if you have questions. COMMON BRAND NAME(S): ANX, Atarax, Rezine, Vistaril What should I tell my care team before I take this medication? They need to know if you have any of these conditions: Glaucoma Heart disease Irregular heartbeat or rhythm Kidney disease Liver disease Lung or breathing disease, such as asthma Stomach or intestine problems Thyroid disease Trouble passing urine An unusual or allergic reaction to hydroxyzine, other medications, foods, dyes or preservatives Pregnant or trying to get pregnant Breastfeeding How should I use this medication? Take this medication by mouth with a full glass of water. Take it as directed on the prescription label at the same time every day. You can take it with or without food. If it upsets your stomach, take it with food. Talk to your care team about the use of this medication in children. While it may be prescribed for children as young as 6 years for selected conditions, precautions do apply. People 65 years and older may have a stronger reaction and need a smaller dose. Overdosage: If you think you have taken too much of this medicine contact a poison control center or emergency room at once. NOTE: This medicine is only for you. Do not share this medicine with others. What if I miss a dose? If you miss a dose, take it as soon as you can. If it is almost time for your next dose, take only that dose. Do not take double or extra doses. What may interact with this medication? Do not  take this medication with any of the following: Cisapride Dronedarone Pimozide Thioridazine This medication may also interact with the following: Alcohol Antihistamines for allergy, cough, and cold Atropine Barbiturate medications for sleep or seizures, such as phenobarbital Certain antibiotics, such as erythromycin or clarithromycin Certain medications for anxiety or sleep Certain medications for bladder problems, such as oxybutynin or tolterodine Certain medications for irregular heartbeat Certain medications for mental health conditions Certain medications for Parkinson disease, such as benztropine, trihexyphenidyl Certain medications for seizures, such as phenobarbital or primidone Certain medications for stomach problems, such as dicyclomine or hyoscyamine Certain medications for travel sickness, such as scopolamine Ipratropium Opioid medications for pain Other medications that cause heart rhythm changes, such as dofetilide This list may not describe all possible interactions. Give your health care provider a list of all the medicines, herbs, non-prescription drugs, or dietary supplements you use. Also tell them if you smoke, drink alcohol, or use illegal drugs. Some items may interact with your medicine. What should I watch for while using this medication? Visit your care team for regular checks on your progress. Tell your care team if your symptoms do not start to get better or if they get worse. This medication may affect your coordination, reaction time, or judgment. Do not drive or operate machinery until you know how this medication affects you. Sit up or stand slowly to reduce the risk of dizzy or fainting spells. Drinking alcohol with this medication can increase the risk of these side effects. Your mouth may get dry. Chewing sugarless gum or sucking hard candy  and drinking plenty of water may help. Contact your care team if the problem does not go away or is severe. This  medication may cause dry eyes and blurred vision. If you wear contact lenses, you may feel some discomfort. Lubricating eye drops may help. See your care team if the problem does not go away or is severe. If you are receiving skin tests for allergies, tell your care team you are taking this medication. What side effects may I notice from receiving this medication? Side effects that you should report to your care team as soon as possible: Allergic reactions--skin rash, itching, hives, swelling of the face, lips, tongue, or throat Heart rhythm changes--fast or irregular heartbeat, dizziness, feeling faint or lightheaded, chest pain, trouble breathing Side effects that usually do not require medical attention (report to your care team if they continue or are bothersome): Confusion Drowsiness Dry mouth Hallucinations Headache This list may not describe all possible side effects. Call your doctor for medical advice about side effects. You may report side effects to FDA at 1-800-FDA-1088. Where should I keep my medication? Keep out of the reach of children and pets. Store at room temperature between 15 and 30 degrees C (59 and 86 degrees F). Keep container tightly closed. Throw away any unused medication after the expiration date. NOTE: This sheet is a summary. It may not cover all possible information. If you have questions about this medicine, talk to your doctor, pharmacist, or health care provider.  2024 Elsevier/Gold Standard (2021-11-19 00:00:00)

## 2024-03-22 ENCOUNTER — Ambulatory Visit (HOSPITAL_BASED_OUTPATIENT_CLINIC_OR_DEPARTMENT_OTHER): Attending: Pulmonary Disease | Admitting: Internal Medicine

## 2024-03-26 ENCOUNTER — Ambulatory Visit: Admitting: Psychiatry

## 2024-04-01 ENCOUNTER — Ambulatory Visit (HOSPITAL_BASED_OUTPATIENT_CLINIC_OR_DEPARTMENT_OTHER)

## 2024-04-03 ENCOUNTER — Other Ambulatory Visit: Payer: Self-pay | Admitting: Psychiatry

## 2024-04-03 DIAGNOSIS — F411 Generalized anxiety disorder: Secondary | ICD-10-CM

## 2024-04-03 DIAGNOSIS — F3341 Major depressive disorder, recurrent, in partial remission: Secondary | ICD-10-CM

## 2024-04-04 ENCOUNTER — Encounter: Payer: Self-pay | Admitting: Psychiatry

## 2024-04-04 ENCOUNTER — Ambulatory Visit: Admitting: Psychiatry

## 2024-04-04 VITALS — BP 113/79 | HR 88 | Temp 97.7°F | Ht 67.0 in | Wt 228.8 lb

## 2024-04-04 DIAGNOSIS — F3341 Major depressive disorder, recurrent, in partial remission: Secondary | ICD-10-CM | POA: Insufficient documentation

## 2024-04-04 DIAGNOSIS — F411 Generalized anxiety disorder: Secondary | ICD-10-CM | POA: Diagnosis not present

## 2024-04-04 DIAGNOSIS — G4700 Insomnia, unspecified: Secondary | ICD-10-CM

## 2024-04-04 MED ORDER — LORAZEPAM 0.5 MG PO TABS: 0.2500 mg | ORAL_TABLET | ORAL | 0 refills | Status: AC

## 2024-04-04 MED ORDER — DOXEPIN HCL 10 MG PO CAPS: 10.0000 mg | ORAL_CAPSULE | Freq: Every day | ORAL | 0 refills | Status: DC

## 2024-04-04 NOTE — Progress Notes (Unsigned)
 [facility] MD OP Progress Note  04/04/2024 12:08 PM Melissa Black  MRN:  [id number]  Chief Complaint:  Chief Complaint  Patient presents with   Follow-up   Anxiety   Insomnia   HPI: Melissa Black is a 43 year old African-American female, lives in [place], divorced, currently on Social Security disability, has a history of depression, anxiety, insomnia was evaluated in office today for a follow-up appointment.  Patient appeared to be alert, oriented, was able to answer questions appropriately.  She reports she is currently having side effects to mirtazapine  and hence had to stop it few days ago.  She reports it gave her vivid dreams and the dosage increase did not help.  She reports although she was taking it she continued to have sleep problems.  She liked the amitriptyline  better since it helped her to sleep although it made her irritable and had to stop it due to that.  She reports overall mood wise she has been doing fairly well.  Since stopping the mirtazapine  she may have noticed some changes in her mood like being more jittery and clumsy however nothing significant at this time.  She however is worried she could get depressed or more anxious in the future and hence would like to start another medication to replace the mirtazapine .  She has been limiting the use of lorazepam  and reports she has several pills left from a previous prescription however would like to have a refill if possible.  She denies any appetite changes.  She denies any suicidality, homicidality or perceptual disturbances.  She was able to spend Thanksgiving with her family, her mother and daughter and this time she actually cooked.  She has not been able to find a psychotherapist yet however is currently on a wait list with our in-house therapist and is motivated to establish care.    Visit Diagnosis: Rule out panic disorder   ICD-10-CM   1. Recurrent major depressive disorder, in partial remission  F33.41      2. GAD (generalized anxiety disorder)  F41.1 doxepin (SINEQUAN) 10 MG capsule    LORazepam  (ATIVAN ) 0.5 MG tablet    3. Insomnia, unspecified type  G47.00 doxepin (SINEQUAN) 10 MG capsule      Past Psychiatric History: I have reviewed past psychiatric history from progress note on 07/31/2023.  Past trials of medications like amitriptyline , duloxetine , Lexapro , venlafaxine, fluoxetine, mirtazapine , Celexa, Wellbutrin, hydroxyzine   Past Medical History:  Past Medical History:  Diagnosis Date   ADHD (attention deficit hyperactivity disorder)    Anemia    h/o   Anxiety    Asthma    Chest pain, unspecified    Genital herpes    History of chlamydia    History of gonorrhea    History of marijuana use    Obesity    SOB (shortness of breath)    Spinal stenosis of lumbar region with radiculopathy     Past Surgical History:  Procedure Laterality Date   CESAREAN SECTION     x1   FOOT SURGERY Right    broken toe   HEMI-MICRODISCECTOMY LUMBAR LAMINECTOMY LEVEL 1 Right 11/10/2021   Procedure: OPEN RIGHT REDO L5-S1 MICRODISCECTOMY;  Surgeon: Clois Fret, MD;  Location: [facility] OR[facility];  Service: Neurosurgery;  Laterality: Right;   LUMBAR LAMINECTOMY/DECOMPRESSION MICRODISCECTOMY Right 08/30/2021   Procedure: RIGHT L4-5 LAMINOFORAMINOTOMY, RIGHT L5-S1 MICRODISCECTOMY;  Surgeon: Clois Fret, MD;  Location: [facility];  Service: Neurosurgery;  Laterality: Right;   THYROID  SURGERY  12/28/2023  Family Psychiatric History: I have reviewed family psychiatric history from progress note on 07/31/2023.  Family History:  Family History  Problem Relation Age of Onset   Depression Mother    ADD / ADHD Son     Social History: I have reviewed social history from progress note on 07/31/2023. Social History   Socioeconomic History   Marital status: Divorced    Spouse name: Not on file   Number of children: 2   Years of education: Not on file   Highest education level: High school  graduate  Occupational History   Not on file  Tobacco Use   Smoking status: Former    Current packs/day: 0.00    Average packs/day: 0.5 packs/day for 20.0 years (10.0 ttl pk-yrs)    Types: Cigarettes    Start date: 2002    Quit date: 2022    Years since quitting: 3.9   Smokeless tobacco: Former   Tobacco comments:    Stopped vaping approximately 4 months back  Vaping Use   Vaping status: Every Day   Substances: Flavoring  Substance and Sexual Activity   Alcohol use: Not Currently   Drug use: Not Currently    Types: Marijuana   Sexual activity: Yes  Other Topics Concern   Not on file  Social History Narrative   Lives with boyfriend and daughter   Social Drivers of Health   Tobacco Use: Medium Risk (04/04/2024)   Patient History    Smoking Tobacco Use: Former    Smokeless Tobacco Use: Former    Passive Exposure: Not on Stage Manager: Not on file  Food Insecurity: No Food Insecurity (12/15/2023)   Received from [facility]   Epic    Within the past 12 months, you worried that your food would run out before you got the money to buy more.: Never true    Within the past 12 months, the food you bought just didn't last and you didn't have money to get more.: Never true  Transportation Needs: No Transportation Needs (12/15/2023)   Received from [facility]   PRAPARE - Transportation    Lack of Transportation (Medical): No    Lack of Transportation (Non-Medical): No  Physical Activity: Not on file  Stress: Not on file  Social Connections: Not on file  Depression (EYV7-0): Medium Risk (04/04/2024)   Depression (PHQ2-9)    PHQ-2 Score: 9  Alcohol Screen: Not on file  Housing: Not on file  Utilities: Low Risk (12/15/2023)   Received from [facility]   Utilities    Within the past 12 months, have you been unable to get utilities(heat, electricity) when it was really needed?: No  Health Literacy: Not on file    Allergies:  Allergies[1]  Metabolic Disorder Labs: No results found for: HGBA1C, MPG No results found for: PROLACTIN No results found for: CHOL, TRIG, HDL, CHOLHDL, VLDL, LDLCALC Lab Results  Component Value Date   TSH 2.914 11/24/2023    Therapeutic Level Labs: No results found for: LITHIUM No results found for: VALPROATE No results found for: CBMZ  Current Medications: Current Outpatient Medications  Medication Sig Dispense Refill   doxepin (SINEQUAN) 10 MG capsule Take 1 capsule (10 mg total) by mouth at bedtime. 30 capsule 0   LORazepam  (ATIVAN ) 0.5 MG tablet Take 0.5-1 tablets (0.25-0.5 mg total) by mouth as directed. Take half to one tablet daily as needed for severe anxiety attacks only , must last 30 days. 12 tablet 0  albuterol  (VENTOLIN  HFA) 108 (90 Base) MCG/ACT inhaler Inhale 2 puffs into the lungs every 6 (six) hours as needed for wheezing or shortness of breath. 8 g 2   cyclobenzaprine  (FLEXERIL ) 10 MG tablet Take 1 tablet (10 mg total) by mouth 3 (three) times daily as needed for up to 20 days. 60 tablet 0   hydrOXYzine  (ATARAX ) 25 MG tablet Take 0.5-1 tablets (12.5-25 mg total) by mouth 3 (three) times daily as needed for anxiety. And sleep 90 tablet 0   medroxyPROGESTERone (DEPO-PROVERA) 150 MG/ML injection Inject into the muscle.     Multiple Vitamin (MULTIVITAMIN WITH MINERALS) TABS tablet Take 1 tablet by mouth in the morning. One A Day Multivitamin for Women     No current facility-administered medications for this visit.     Musculoskeletal: Strength & Muscle Tone: within normal limits Gait & Station: normal Patient leans: N/A  Psychiatric Specialty Exam: Review of Systems  Psychiatric/Behavioral:  Positive for sleep disturbance. The patient is nervous/anxious.     Blood pressure 113/79, pulse 88, temperature 97.7 F (36.5 C), temperature source Temporal, height 5' 7 (1.702 m), weight 228 lb 12.8 oz (103.8 kg).Body mass index is 35.84  kg/m.  General Appearance: Casual  Eye Contact:  Fair  Speech:  Normal Rate  Volume:  Normal  Mood:  Anxious  Affect:  Congruent  Thought Process:  Goal Directed and Descriptions of Associations: Intact  Orientation:  Full (Time, Place, and Person)  Thought Content: Logical   Suicidal Thoughts:  No  Homicidal Thoughts:  No  Memory:  Immediate;   Fair Recent;   Fair Remote;   Fair  Judgement:  Fair  Insight:  Fair  Psychomotor Activity:  Normal  Concentration:  Concentration: Fair and Attention Span: Fair  Recall:  [employer] of Knowledge: Fair  Language: Fair  Akathisia:  No  Handed:  Right  AIMS (if indicated): not done  Assets:  Manufacturing Systems Engineer Desire for Improvement Housing Social Support Transportation  ADL's:  Intact  Cognition: WNL  Sleep:  Poor   Screenings: GAD-7    Loss Adjuster, Chartered Office Visit from 04/04/2024 in [facility] Office Visit from 03/05/2024 in [facility] Office Visit from 07/31/2023 in [facility]  Total GAD-7 Score 4 5 15    PHQ2-9    Flowsheet Row Office Visit from 04/04/2024 in [facility] Office Visit from 03/05/2024 in [facility] Office Visit from 01/03/2024 in [facility] Office Visit from 07/31/2023 in [facility]  PHQ-2 Total Score 2 2 2 5   PHQ-9 Total Score 9 7 6 16    Flowsheet Row Office Visit from 04/04/2024 in [facility] Office Visit from 03/05/2024 in [facility] Video Visit from 02/14/2024 in [facility]  C-SSRS RISK CATEGORY No Risk No Risk No Risk     Assessment and Plan: Yuriko Portales is a 43 year old African-American  female who has a history of depression, anxiety, insomnia, chronic pain was evaluated in office today.  Discussed assessment and plan as noted below.  1. Recurrent major depressive disorder, in partial remission Currently reports overall mood symptoms as stable although she continues to have sleep problems and anxiety symptoms . Encouraged to establish care with therapist, communicated with staff. Start Doxepin 10 mg at bedtime  2. GAD (  generalized anxiety disorder)-improving Overall anxiety symptoms may have improved however since stopping the mirtazapine  she has noticed herself to be more jittery and clumsy. Continue Lorazepam  0.25-0.5 mg as needed for severe anxiety attacks only patient to limit use Continue Hydroxyzine  12.5-25 mg 3 times a day as needed.  She has been limiting use. Start Doxepin 10 mg at bedtime Provided medication education, discussed side effects including severe allergic reactions, dizziness, drowsiness, constipation, blurry vision, urinary retention. Reviewed [place] PMP AWARxE   3. Insomnia, unspecified type-unstable Continues to have sleep problems. Start Doxepin 10 mg at bedtime.  Will consider increasing the dosage in the future if needed. Provided medication education.  Follow-up Follow-up in clinic in 2 weeks or sooner if needed.    Collaboration of Care: Collaboration of Care: Referral or follow-up with counselor/therapist AEB encouraged to establish care with therapist.  Patient/Guardian was advised Release of Information must be obtained prior to any record release in order to collaborate their care with an outside provider. Patient/Guardian was advised if they have not already done so to contact the registration department to sign all necessary forms in order for us  to release information regarding their care.   Consent: Patient/Guardian gives verbal consent for treatment and assignment of benefits for services provided during this visit. Patient/Guardian  expressed understanding and agreed to proceed.   This note was generated in part or whole with voice recognition software. Voice recognition is usually quite accurate but there are transcription errors that can and very often do occur. I apologize for any typographical errors that were not detected and corrected.    Chaun Uemura, MD 04/05/2024, 8:22 AM     [1]  Allergies Allergen Reactions   Codeine Swelling   Penicillins Swelling and Other (See Comments)    TOLERATED CEFAZOLIN  Has patient had a PCN reaction causing immediate rash, facial/tongue/throat swelling, SOB or lightheadedness with hypotension: yes Has patient had a PCN reaction causing severe rash involving mucus membranes or skin necrosis: no Has patient had a PCN reaction that required hospitalization no Has patient had a PCN reaction occurring within the last 10 years: no If all of the above answers are NO, then may proceed with Cephalosporin use.    Benadryl [Diphenhydramine] Hives   Bupropion Palpitations    Heart palpitations.

## 2024-04-04 NOTE — Patient Instructions (Signed)
Doxepin Capsules What is this medication? DOXEPIN (DOX e pin) treats depression and anxiety. It increases the amount of serotonin and norepinephrine in the brain, hormones that help regulate mood. It belongs to a group of medications called tricyclic antidepressants (TCAs). This medicine may be used for other purposes; ask your health care provider or pharmacist if you have questions. COMMON BRAND NAME(S): Sinequan What should I tell my care team before I take this medication? They need to know if you have any of these conditions: Bipolar disorder Difficulty passing urine Frequently drink alcohol Glaucoma Heart disease Liver disease Lung or breathing disease, such as asthma or sleep apnea Prostate trouble Schizophrenia Seizures Suicidal thoughts, plans, or attempt by you or a family member An unusual or allergic reaction to doxepin, other medications, foods, dyes, or preservatives Pregnant or trying to get pregnant Breastfeeding How should I use this medication? Take this medication by mouth with a glass of water. Follow the directions on the prescription label. Take your doses at regular intervals. Do not take your medication more often than directed. Do not stop taking this medication suddenly except upon the advice of your care team. Stopping this medication too quickly may cause serious side effects or your condition may worsen. A special MedGuide will be given to you by the pharmacist with each prescription and refill. Be sure to read this information carefully each time. Talk to your care team about the use of this medication in children. While this medication may be prescribed for children as young as 12 years for selected conditions, precautions do apply. Overdosage: If you think you have taken too much of this medicine contact a poison control center or emergency room at once. NOTE: This medicine is only for you. Do not share this medicine with others. What if I miss a dose? If  you miss a dose, take it as soon as you can. If it is almost time for your next dose, take only that dose. Do not take double or extra doses. What may interact with this medication? Do not take this medication with any of the following: Arsenic trioxide Certain medications for irregular heartbeat or other heart conditions Cisapride Halofantrine Levomethadyl Linezolid MAOIs, such as Carbex, Eldepryl, Marplan, Nardil, and Parnate Methylene blue Other medications for depression Phenothiazines, such as perphenazine, thioridazine, and chlorpromazine Pimozide Procarbazine Sparfloxacin St. John's Wort This medication may also interact with the following: Cimetidine Tolazamide Ziprasidone This list may not describe all possible interactions. Give your health care provider a list of all the medicines, herbs, non-prescription drugs, or dietary supplements you use. Also tell them if you smoke, drink alcohol, or use illegal drugs. Some items may interact with your medicine. What should I watch for while using this medication? Visit your care team for regular checks on your progress. It can take several days before you feel the full effect of this medication. If you have been taking this medication regularly for some time, do not suddenly stop taking it. You must gradually reduce the dose, or you may get severe side effects. Ask your care team for advice. Even after you stop taking this medication it can still affect your body for several days. Patients and their families should watch out for new or worsening thoughts of suicide or depression. Also watch out for sudden changes in feelings such as feeling anxious, agitated, panicky, irritable, hostile, aggressive, impulsive, severely restless, overly excited and hyperactive, or not being able to sleep. If this happens, especially at the beginning of  treatment or after a change in dose, call your care team. This medication may affect your coordination,  reaction time, or judgment. Do not drive or operate machinery until you know how this medication affects you. Sit up or stand slowly to reduce the risk of dizzy or fainting spells. Drinking alcohol with this medication can increase the risk of these side effects. Do not treat yourself for coughs, colds, or allergies without asking your care team for advice. Some ingredients can increase possible side effects. Your mouth may get dry. Chewing sugarless gum or sucking hard candy and drinking plenty of water may help. Contact your care team if the problem does not go away or is severe. This medication may cause dry eyes and blurred vision. If you wear contact lenses you, may feel some discomfort. Lubricating eye drops may help. See your care team if the problem does not go away or is severe. This medication can make you more sensitive to the sun. Keep out of the sun. If you cannot avoid being in the sun, wear protective clothing and sunscreen. Do not use sun lamps, tanning beds, or tanning booths. What side effects may I notice from receiving this medication? Side effects that you should report to your care team as soon as possible: Allergic reactions--skin rash, itching, hives, swelling of the face, lips, tongue, or throat Irritability, confusion, fast or irregular heartbeat, muscle stiffness, twitching muscles, sweating, high fever, seizure, chills, vomiting, diarrhea, which may be signs of serotonin syndrome Sudden eye pain or change in vision such as blurry vision, seeing halos around lights, vision loss Thoughts of suicide or self-harm, worsening mood, or feelings of depression Trouble passing urine Side effects that usually do not require medical attention (report to your care team if they continue or are bothersome): Change in sex drive or performance Constipation Dizziness Drowsiness Dry mouth Tremors Weight gain This list may not describe all possible side effects. Call your doctor for  medical advice about side effects. You may report side effects to FDA at 1-800-FDA-1088. Where should I keep my medication? Keep out of the reach of children. Store at room temperature between 15 and 30 degrees C (59 and 86 degrees F). Throw away any unused medication after the expiration date. NOTE: This sheet is a summary. It may not cover all possible information. If you have questions about this medicine, talk to your doctor, pharmacist, or health care provider.  2024 Elsevier/Gold Standard (2021-11-11 00:00:00)

## 2024-04-15 ENCOUNTER — Ambulatory Visit

## 2024-04-15 NOTE — Progress Notes (Signed)
 Clinician attempted session in session but Melissa Black did not appear for the session. Per [facility] policy, Pt. will be charged a no-show fee.

## 2024-04-16 ENCOUNTER — Telehealth: Admitting: Psychiatry

## 2024-04-16 ENCOUNTER — Encounter: Payer: Self-pay | Admitting: Psychiatry

## 2024-04-16 DIAGNOSIS — F331 Major depressive disorder, recurrent, moderate: Secondary | ICD-10-CM

## 2024-04-16 DIAGNOSIS — F411 Generalized anxiety disorder: Secondary | ICD-10-CM | POA: Diagnosis not present

## 2024-04-16 DIAGNOSIS — G4700 Insomnia, unspecified: Secondary | ICD-10-CM | POA: Diagnosis not present

## 2024-04-16 MED ORDER — DOXEPIN HCL 25 MG PO CAPS: 25.0000 mg | ORAL_CAPSULE | Freq: Every day | ORAL | 0 refills | Status: DC

## 2024-04-16 NOTE — Progress Notes (Signed)
 Virtual Visit via Video Note  I connected with Melissa Black on 04/16/2024 at 10:00 AM EST by a video enabled telemedicine application and verified that I am speaking with the correct person using two identifiers.  Location Provider Location : [facility] Patient Location : Home  Participants: Patient , Provider   I discussed the limitations of evaluation and management by telemedicine and the availability of in person appointments. The patient expressed understanding and agreed to proceed.   I discussed the assessment and treatment plan with the patient. The patient was provided an opportunity to ask questions and all were answered. The patient agreed with the plan and demonstrated an understanding of the instructions.   The patient was advised to call back or seek an in-person evaluation if the symptoms worsen or if the condition fails to improve as anticipated.  [facility] MD OP Progress Note  04/16/2024 10:36 AM Melissa Black  MRN:  [id number]  Chief Complaint:  Chief Complaint  Patient presents with   Follow-up   Anxiety   Medication Refill   Depression   HPI: Melissa Black is a 43 year old African-American female, lives in [place], divorced, currently on Social Security disability, has a history of depression, anxiety, insomnia was evaluated by telemedicine today for a follow-up appointment.  Patient today reports she is currently tolerating the doxepin  which was initiated recently.  She reports she has not had any significant side effects.  She however does not believe the doxepin  has helped with her sleep.  She continues to have difficulty falling asleep and sleep is interrupted as well.  She reports she liked the amitriptyline  better since it helped her sleep although it made her angry.  She is currently struggling with depression symptoms.  She reports she has been struggling with depression for a very long time and would like help with that.  She describes sadness and  anhedonia.  She wonders why the doxepin  is not helping with her depression symptoms although she has taken it for 10 days.  She has not had any significant anxiety attacks recently.  She was referred for psychotherapy sessions however it looks like she was a no-show with our in-house therapist for an appointment scheduled for 04/15/2024.  She denies any appetite changes.  She denies any suicidality, homicidality or perceptual disturbances.  Although initially hesitant patient agreed to dosage increase of doxepin  to target her anxiety depression and sleep.    Visit Diagnosis:    ICD-10-CM   1. MDD (major depressive disorder), recurrent episode, moderate (HCC)  F33.1 doxepin  (SINEQUAN ) 25 MG capsule    2. GAD (generalized anxiety disorder)  F41.1 doxepin  (SINEQUAN ) 25 MG capsule    3. Insomnia, unspecified type  G47.00       Past Psychiatric History: I have reviewed past psychiatric history from progress note on 07/31/2023.  Past trials of medications like amitriptyline , duloxetine , Lexapro , venlafaxine, fluoxetine, mirtazapine , Celexa, Wellbutrin, hydroxyzine .  Past Medical History:  Past Medical History:  Diagnosis Date   ADHD (attention deficit hyperactivity disorder)    Anemia    h/o   Anxiety    Asthma    Chest pain, unspecified    Genital herpes    History of chlamydia    History of gonorrhea    History of marijuana use    Obesity    SOB (shortness of breath)    Spinal stenosis of lumbar region with radiculopathy     Past Surgical History:  Procedure Laterality Date  CESAREAN SECTION     x1   FOOT SURGERY Right    broken toe   HEMI-MICRODISCECTOMY LUMBAR LAMINECTOMY LEVEL 1 Right 11/10/2021   Procedure: OPEN RIGHT REDO L5-S1 MICRODISCECTOMY;  Surgeon: Clois Fret, MD;  Location: [facility] ORS;  Service: Neurosurgery;  Laterality: Right;   LUMBAR LAMINECTOMY/DECOMPRESSION MICRODISCECTOMY Right 08/30/2021   Procedure: RIGHT L4-5 LAMINOFORAMINOTOMY, RIGHT L5-S1  MICRODISCECTOMY;  Surgeon: Clois Fret, MD;  Location: [facility] ORS;  Service: Neurosurgery;  Laterality: Right;   THYROID  SURGERY  12/28/2023    Family Psychiatric History: I have reviewed family psychiatric history from progress note on 07/31/2023.  Family History:  Family History  Problem Relation Age of Onset   Depression Mother    ADD / ADHD Son     Social History: I have reviewed social history from progress note on 07/31/2023. Social History   Socioeconomic History   Marital status: Divorced    Spouse name: Not on file   Number of children: 2   Years of education: Not on file   Highest education level: High school graduate  Occupational History   Not on file  Tobacco Use   Smoking status: Former    Current packs/day: 0.00    Average packs/day: 0.5 packs/day for 20.0 years (10.0 ttl pk-yrs)    Types: Cigarettes    Start date: 2002    Quit date: 2022    Years since quitting: 3.9   Smokeless tobacco: Former   Tobacco comments:    Stopped vaping approximately 4 months back  Vaping Use   Vaping status: Every Day   Substances: Flavoring  Substance and Sexual Activity   Alcohol use: Not Currently   Drug use: Not Currently    Types: Marijuana   Sexual activity: Yes  Other Topics Concern   Not on file  Social History Narrative   Lives with boyfriend and daughter   Social Drivers of Health   Tobacco Use: Medium Risk (04/16/2024)   Patient History    Smoking Tobacco Use: Former    Smokeless Tobacco Use: Former    Passive Exposure: Not on Stage Manager: Not on file  Food Insecurity: No Food Insecurity (12/15/2023)   Received from [facility]   Epic    Within the past 12 months, you worried that your food would run out before you got the money to buy more.: Never true    Within the past 12 months, the food you bought just didn't last and you didn't have money to get more.: Never true  Transportation Needs: No Transportation Needs (12/15/2023)    Received from [facility]   PRAPARE - Transportation    Lack of Transportation (Medical): No    Lack of Transportation (Non-Medical): No  Physical Activity: Not on file  Stress: Not on file  Social Connections: Not on file  Depression (PHQ2-9): High Risk (04/16/2024)   Depression (PHQ2-9)    PHQ-2 Score: 12  Alcohol Screen: Not on file  Housing: Not on file  Utilities: Low Risk (12/15/2023)   Received from [facility]   Utilities    Within the past 12 months, have you been unable to get utilities(heat, electricity) when it was really needed?: No  Health Literacy: Not on file    Allergies: Allergies[1]  Metabolic Disorder Labs: No results found for: HGBA1C, MPG No results found for: PROLACTIN No results found for: CHOL, TRIG, HDL, CHOLHDL, VLDL, LDLCALC Lab Results  Component Value Date   TSH 2.914 11/24/2023  Therapeutic Level Labs: No results found for: LITHIUM No results found for: VALPROATE No results found for: CBMZ  Current Medications: Current Outpatient Medications  Medication Sig Dispense Refill   doxepin  (SINEQUAN ) 25 MG capsule Take 1 capsule (25 mg total) by mouth at bedtime. 30 capsule 0   albuterol  (VENTOLIN  HFA) 108 (90 Base) MCG/ACT inhaler Inhale 2 puffs into the lungs every 6 (six) hours as needed for wheezing or shortness of breath. 8 g 2   cyclobenzaprine  (FLEXERIL ) 10 MG tablet Take 1 tablet (10 mg total) by mouth 3 (three) times daily as needed for up to 20 days. 60 tablet 0   hydrOXYzine  (ATARAX ) 25 MG tablet Take 0.5-1 tablets (12.5-25 mg total) by mouth 3 (three) times daily as needed for anxiety. And sleep 90 tablet 0   LORazepam  (ATIVAN ) 0.5 MG tablet Take 0.5-1 tablets (0.25-0.5 mg total) by mouth as directed. Take half to one tablet daily as needed for severe anxiety attacks only , must last 30 days. 12 tablet 0   medroxyPROGESTERone (DEPO-PROVERA) 150 MG/ML injection Inject into the muscle.     Multiple  Vitamin (MULTIVITAMIN WITH MINERALS) TABS tablet Take 1 tablet by mouth in the morning. One A Day Multivitamin for Women     No current facility-administered medications for this visit.     Musculoskeletal: Strength & Muscle Tone: UTA Gait & Station: Seated Patient leans: N/A  Psychiatric Specialty Exam: Review of Systems  Psychiatric/Behavioral:  Positive for dysphoric mood and sleep disturbance.     There were no vitals taken for this visit.There is no height or weight on file to calculate BMI.  General Appearance: Casual  Eye Contact:  Fair  Speech:  Clear and Coherent  Volume:  varies , increased at times  Mood:  Depressed  Affect:  Congruent, irritable  Thought Process:  Goal Directed and Descriptions of Associations: Circumstantial  Orientation:  Full (Time, Place, and Person)  Thought Content: Rumination   Suicidal Thoughts:  No  Homicidal Thoughts:  No  Memory:  Immediate;   Fair Recent;   Fair Remote;   Fair  Judgement:  Fair  Insight:  Fair  Psychomotor Activity:  Increased  Concentration:  Concentration: Fair and Attention Span: Fair  Recall:  [employer] of Knowledge: Fair  Language: Fair  Akathisia:  No  Handed:  Right  AIMS (if indicated): not done  Assets:  Manufacturing Systems Engineer Desire for Improvement Housing Social Support Transportation  ADL's:  Intact  Cognition: WNL  Sleep:  Poor   Screenings: GAD-7    Loss Adjuster, Chartered Office Visit from 04/04/2024 in [facility] Office Visit from 03/05/2024 in [facility] Office Visit from 07/31/2023 in [facility]  Total GAD-7 Score 4 5 15    PHQ2-9    Flowsheet Row Video Visit from 04/16/2024 in [facility] Office Visit from 04/04/2024 in [facility] Office Visit from 03/05/2024 in [facility] Office Visit from 01/03/2024 in [facility] Office Visit from 07/31/2023 in [facility]  PHQ-2 Total Score 5 2 2 2 5   PHQ-9 Total Score 12 9 7 6 16    Flowsheet Row Video Visit from 04/16/2024 in [facility] Office Visit from 04/04/2024 in [facility] Office Visit from 03/05/2024 in [facility]  C-SSRS RISK CATEGORY No Risk No Risk No Risk     Assessment and Plan: Melissa Black is a 43 year old African-American female who presented for a follow-up appointment, discussed assessment and plan as noted below.  1. MDD (major depressive disorder), recurrent episode, moderate (HCC)-unstable Currently reports she is struggling with depression symptoms.  Agreeable to dosage increase of doxepin  to target both mood symptoms and sleep.  Provided education that antidepressants takes time , likely 8 to 10 weeks to be effective and also may need dosage increase for benefit.  Also provided education about the benefit of psychotherapy sessions along with medication management for the treatment of depression. Increase Doxepin  to 25 mg at bedtime. Encouraged to establish care with therapist to start CBT. Patient no-showed her appointment with in-house therapist yesterday.  Advised to contact to schedule another appointment.  2. GAD (generalized anxiety disorder)-improving Overall anxiety symptoms improved and currently denies any significant panic attacks. Continue Hydroxyzine  12.5-25 mg 3 times a day as needed Continue Doxepin  as noted above. Continue Lorazepam  0.25-0.5 mg as needed for severe anxiety attacks.  Patient to limit use. Reviewed [place] PMP AWARxE Encouraged to establish care with therapist to start CBT  3. Insomnia, unspecified type-unstable Currently struggling with sleep  and notes no improvement with the doxepin . Increase Doxepin  to 25 mg at bedtime Encouraged to work on sleep hygiene techniques.     Follow-up Follow-up in clinic in 3 weeks or sooner if needed.  Patient placed on a wait list for a sooner in person visit.  Collaboration of Care: Collaboration of Care: Referral or follow-up with counselor/therapist AEB patient encouraged to establish care with therapist.  I have communicated with staff.  Patient/Guardian was advised Release of Information must be obtained prior to any record release in order to collaborate their care with an outside provider. Patient/Guardian was advised if they have not already done so to contact the registration department to sign all necessary forms in order for us  to release information regarding their care.   Consent: Patient/Guardian gives verbal consent for treatment and assignment of benefits for services provided during this visit. Patient/Guardian expressed understanding and agreed to proceed.  This note was generated in part or whole with voice recognition software. Voice recognition is usually quite accurate but there are transcription errors that can and very often do occur. I apologize for any typographical errors that were not detected and corrected.     Melissa Huisman, MD 04/17/2024, 7:56 AM     [1]  Allergies Allergen Reactions   Codeine Swelling   Penicillins Swelling and Other (See Comments)    TOLERATED CEFAZOLIN  Has patient had a PCN reaction causing immediate rash, facial/tongue/throat swelling, SOB or lightheadedness with hypotension: yes Has patient had a PCN reaction causing severe rash involving mucus membranes or skin necrosis: no Has patient had a PCN reaction that required hospitalization no Has patient had a PCN reaction occurring within the last 10 years: no If all of the above answers are NO, then may proceed with Cephalosporin use.    Benadryl [Diphenhydramine] Hives   Bupropion  Palpitations    Heart palpitations.

## 2024-04-16 NOTE — Patient Instructions (Signed)
Doxepin Capsules What is this medication? DOXEPIN (DOX e pin) treats depression and anxiety. It increases the amount of serotonin and norepinephrine in the brain, hormones that help regulate mood. It belongs to a group of medications called tricyclic antidepressants (TCAs). This medicine may be used for other purposes; ask your health care provider or pharmacist if you have questions. COMMON BRAND NAME(S): Sinequan What should I tell my care team before I take this medication? They need to know if you have any of these conditions: Bipolar disorder Difficulty passing urine Frequently drink alcohol Glaucoma Heart disease Liver disease Lung or breathing disease, such as asthma or sleep apnea Prostate trouble Schizophrenia Seizures Suicidal thoughts, plans, or attempt by you or a family member An unusual or allergic reaction to doxepin, other medications, foods, dyes, or preservatives Pregnant or trying to get pregnant Breastfeeding How should I use this medication? Take this medication by mouth with a glass of water. Follow the directions on the prescription label. Take your doses at regular intervals. Do not take your medication more often than directed. Do not stop taking this medication suddenly except upon the advice of your care team. Stopping this medication too quickly may cause serious side effects or your condition may worsen. A special MedGuide will be given to you by the pharmacist with each prescription and refill. Be sure to read this information carefully each time. Talk to your care team about the use of this medication in children. While this medication may be prescribed for children as young as 12 years for selected conditions, precautions do apply. Overdosage: If you think you have taken too much of this medicine contact a poison control center or emergency room at once. NOTE: This medicine is only for you. Do not share this medicine with others. What if I miss a dose? If  you miss a dose, take it as soon as you can. If it is almost time for your next dose, take only that dose. Do not take double or extra doses. What may interact with this medication? Do not take this medication with any of the following: Arsenic trioxide Certain medications for irregular heartbeat or other heart conditions Cisapride Halofantrine Levomethadyl Linezolid MAOIs, such as Carbex, Eldepryl, Marplan, Nardil, and Parnate Methylene blue Other medications for depression Phenothiazines, such as perphenazine, thioridazine, and chlorpromazine Pimozide Procarbazine Sparfloxacin St. John's Wort This medication may also interact with the following: Cimetidine Tolazamide Ziprasidone This list may not describe all possible interactions. Give your health care provider a list of all the medicines, herbs, non-prescription drugs, or dietary supplements you use. Also tell them if you smoke, drink alcohol, or use illegal drugs. Some items may interact with your medicine. What should I watch for while using this medication? Visit your care team for regular checks on your progress. It can take several days before you feel the full effect of this medication. If you have been taking this medication regularly for some time, do not suddenly stop taking it. You must gradually reduce the dose, or you may get severe side effects. Ask your care team for advice. Even after you stop taking this medication it can still affect your body for several days. Patients and their families should watch out for new or worsening thoughts of suicide or depression. Also watch out for sudden changes in feelings such as feeling anxious, agitated, panicky, irritable, hostile, aggressive, impulsive, severely restless, overly excited and hyperactive, or not being able to sleep. If this happens, especially at the beginning of  treatment or after a change in dose, call your care team. This medication may affect your coordination,  reaction time, or judgment. Do not drive or operate machinery until you know how this medication affects you. Sit up or stand slowly to reduce the risk of dizzy or fainting spells. Drinking alcohol with this medication can increase the risk of these side effects. Do not treat yourself for coughs, colds, or allergies without asking your care team for advice. Some ingredients can increase possible side effects. Your mouth may get dry. Chewing sugarless gum or sucking hard candy and drinking plenty of water may help. Contact your care team if the problem does not go away or is severe. This medication may cause dry eyes and blurred vision. If you wear contact lenses you, may feel some discomfort. Lubricating eye drops may help. See your care team if the problem does not go away or is severe. This medication can make you more sensitive to the sun. Keep out of the sun. If you cannot avoid being in the sun, wear protective clothing and sunscreen. Do not use sun lamps, tanning beds, or tanning booths. What side effects may I notice from receiving this medication? Side effects that you should report to your care team as soon as possible: Allergic reactions--skin rash, itching, hives, swelling of the face, lips, tongue, or throat Irritability, confusion, fast or irregular heartbeat, muscle stiffness, twitching muscles, sweating, high fever, seizure, chills, vomiting, diarrhea, which may be signs of serotonin syndrome Sudden eye pain or change in vision such as blurry vision, seeing halos around lights, vision loss Thoughts of suicide or self-harm, worsening mood, or feelings of depression Trouble passing urine Side effects that usually do not require medical attention (report to your care team if they continue or are bothersome): Change in sex drive or performance Constipation Dizziness Drowsiness Dry mouth Tremors Weight gain This list may not describe all possible side effects. Call your doctor for  medical advice about side effects. You may report side effects to FDA at 1-800-FDA-1088. Where should I keep my medication? Keep out of the reach of children. Store at room temperature between 15 and 30 degrees C (59 and 86 degrees F). Throw away any unused medication after the expiration date. NOTE: This sheet is a summary. It may not cover all possible information. If you have questions about this medicine, talk to your doctor, pharmacist, or health care provider.  2024 Elsevier/Gold Standard (2021-11-11 00:00:00)

## 2024-04-21 NOTE — Progress Notes (Deleted)
" ° °  Established Patient Pulmonology Office Visit   Subjective:  Patient ID: Melissa Black, female    DOB: March 13, 1981  MRN: [id number]  CC: No chief complaint on file.   HPI  Melissa Black is a 43 y.o. female with PMH significant for GAD, MDD, hyperhidrosis, goiter now s/p R hemithyroidectomy who presents for follow up of exertional dyspnea.   Last seen on 12/2023, unclear etiology of dyspnea. Recommended CPET. Continued Stiolto.    {PULM QUESTIONNAIRES (Optional):33196}  ROS  {History (Optional):23778} Current Medications[1]      Objective:  There were no vitals taken for this visit. {Pulm Vitals (Optional):32837}  Physical Exam   Diagnostic Review:  {Labs (Optional):32838}  PFTs: FEV1/SVC 79%, LLN 72%, there's slight scooping in flow volume loop. Borderline obstruction. CXR on 11/17/2023 relatively normal CT Chest 03/01/2023 shows mild upper lobe emphysematous changes as well as scattered < 6 mm pulmonary nodules. 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 CTA Chest 10/2023: mild emphysema IMPRESSION:  No pulmonary embolism or other acute findings.  Echo 02/14/2024: 1. Left ventricular ejection fraction, by estimation, is 55 to 60%. Left  ventricular ejection fraction by 3D volume is 62 %. The left ventricle has  normal function. The left ventricle has no regional wall motion  abnormalities. Left ventricular diastolic   parameters were normal. The average left ventricular global longitudinal  strain is -19.6 %. The global longitudinal strain is normal.   2. Right ventricular systolic function is normal. The right ventricular  size is normal. There is normal pulmonary artery systolic pressure. The  estimated right ventricular systolic pressure is 31.5 mmHg.   3. The mitral valve is grossly normal. Trivial mitral valve  regurgitation. No evidence of mitral stenosis.   4. The aortic valve is tricuspid. Aortic  valve regurgitation is trivial.  No aortic stenosis is present.   5. The inferior vena cava is normal in size with greater than 50%  respiratory variability, suggesting right atrial pressure of 3 mmHg.      Assessment & Plan:   Assessment & Plan   No orders of the defined types were placed in this encounter.     No follow-ups on file.   Dillan Lunden, MD    [1]  Current Outpatient Medications:    albuterol  (VENTOLIN  HFA) 108 (90 Base) MCG/ACT inhaler, Inhale 2 puffs into the lungs every 6 (six) hours as needed for wheezing or shortness of breath., Disp: 8 g, Rfl: 2   cyclobenzaprine  (FLEXERIL ) 10 MG tablet, Take 1 tablet (10 mg total) by mouth 3 (three) times daily as needed for up to 20 days., Disp: 60 tablet, Rfl: 0   doxepin  (SINEQUAN ) 25 MG capsule, Take 1 capsule (25 mg total) by mouth at bedtime., Disp: 30 capsule, Rfl: 0   hydrOXYzine  (ATARAX ) 25 MG tablet, Take 0.5-1 tablets (12.5-25 mg total) by mouth 3 (three) times daily as needed for anxiety. And sleep, Disp: 90 tablet, Rfl: 0   LORazepam  (ATIVAN ) 0.5 MG tablet, Take 0.5-1 tablets (0.25-0.5 mg total) by mouth as directed. Take half to one tablet daily as needed for severe anxiety attacks only , must last 30 days., Disp: 12 tablet, Rfl: 0   medroxyPROGESTERone (DEPO-PROVERA) 150 MG/ML injection, Inject into the muscle., Disp: , Rfl:    Multiple Vitamin (MULTIVITAMIN WITH MINERALS) TABS tablet, Take 1 tablet by mouth in the morning. One A Day Multivitamin for Women, Disp: , Rfl:   "

## 2024-04-22 ENCOUNTER — Ambulatory Visit (HOSPITAL_BASED_OUTPATIENT_CLINIC_OR_DEPARTMENT_OTHER): Admitting: Pulmonary Disease

## 2024-04-22 ENCOUNTER — Encounter (HOSPITAL_BASED_OUTPATIENT_CLINIC_OR_DEPARTMENT_OTHER): Payer: Self-pay | Admitting: Pulmonary Disease

## 2024-04-22 DIAGNOSIS — Z029 Encounter for administrative examinations, unspecified: Secondary | ICD-10-CM | POA: Insufficient documentation

## 2024-04-30 ENCOUNTER — Ambulatory Visit: Admitting: Psychiatry

## 2024-05-02 ENCOUNTER — Ambulatory Visit

## 2024-05-06 NOTE — Progress Notes (Unsigned)
 "  Referring Physician:  [facility] [address] [place],  [place] [address]  Primary Physician:  [facility]  History of Present Illness: 05/06/2024*** [name] has a history of asthma, depression, GAD.   She is s/p right redo L5-S2 microdiscectomy by Dr. Clois on 11/10/21. Last seen on 05/10/22 when she was still having LBP and right leg numbness. Further imaging (MRI, lumbar xrays with flex/ext) discussed and she declined. She was referred to pain management for medical management.       Duration: *** Location: *** Quality: *** Severity: ***  Precipitating: aggravated by *** Modifying factors: made better by *** Weakness: none Timing: ***  Tobacco use: Does not smoke. She vapes***  Bowel/Bladder Dysfunction: none  Conservative measures:  Physical therapy: ***  Multimodal medical therapy including regular antiinflammatories: ***  Injections: *** epidural steroid injections  Past Surgery:  right redo L5-S2 microdiscectomy by Dr. Clois on 11/10/21 Right L5-S1 microdisectomy and right L4-L5 laminoforaminotomy by Dr. Clois on 08/30/21  Brad LOISE Erasmo [name] has ***no symptoms of cervical myelopathy.  The symptoms are causing a significant impact on the patient's life.   Review of Systems:  A 10 point review of systems is negative, except for the pertinent positives and negatives detailed in the HPI.  Past Medical History: Past Medical History:  Diagnosis Date   ADHD (attention deficit hyperactivity disorder)    Anemia    h/o   Anxiety    Asthma    Chest pain, unspecified    Genital herpes    History of chlamydia    History of gonorrhea    History of marijuana use    Obesity    SOB (shortness of breath)    Spinal stenosis of lumbar region with radiculopathy     Past Surgical History: Past Surgical History:  Procedure Laterality Date   CESAREAN SECTION     x1   FOOT  SURGERY Right    broken toe   HEMI-MICRODISCECTOMY LUMBAR LAMINECTOMY LEVEL 1 Right 11/10/2021   Procedure: OPEN RIGHT REDO L5-S1 MICRODISCECTOMY;  Surgeon: Clois Fret, MD;  Location: [facility] ORS;  Service: Neurosurgery;  Laterality: Right;   LUMBAR LAMINECTOMY/DECOMPRESSION MICRODISCECTOMY Right 08/30/2021   Procedure: RIGHT L4-5 LAMINOFORAMINOTOMY, RIGHT L5-S1 MICRODISCECTOMY;  Surgeon: Clois Fret, MD;  Location: [facility] ORS;  Service: Neurosurgery;  Laterality: Right;   THYROID  SURGERY  12/28/2023    Allergies: Allergies as of 05/09/2024 - Review Complete 04/16/2024  Allergen Reaction Noted   Codeine Swelling 04/02/2015   Penicillins Swelling and Other (See Comments) 04/02/2015   Benadryl [diphenhydramine] Hives 09/04/2015   Bupropion Palpitations 02/06/2019    Medications: Outpatient Encounter Medications as of 05/09/2024  Medication Sig   albuterol  (VENTOLIN  HFA) 108 (90 Base) MCG/ACT inhaler Inhale 2 puffs into the lungs every 6 (six) hours as needed for wheezing or shortness of breath.   cyclobenzaprine  (FLEXERIL ) 10 MG tablet Take 1 tablet (10 mg total) by mouth 3 (three) times daily as needed for up to 20 days.   doxepin  (SINEQUAN ) 25 MG capsule Take 1 capsule (25 mg total) by mouth at bedtime.   hydrOXYzine  (ATARAX ) 25 MG tablet Take 0.5-1 tablets (12.5-25 mg total) by mouth 3 (three) times daily as needed for anxiety. And sleep   medroxyPROGESTERone (DEPO-PROVERA) 150 MG/ML injection Inject into the muscle.   Multiple Vitamin (MULTIVITAMIN WITH MINERALS) TABS tablet Take 1 tablet by mouth in the morning. One A Day Multivitamin for Women  No facility-administered encounter medications on file as of 05/09/2024.    Social History: Social History[1]  Family Medical History: Family History  Problem Relation Age of Onset   Depression Mother    ADD / ADHD Son     Physical Examination: There were no vitals filed for this visit.  Awake, alert, oriented to  person, place, and time.  Speech is clear and fluent. Fund of knowledge is appropriate.   Cranial Nerves: Pupils equal round and reactive to light.  Facial tone is symmetric.    *** ROM of cervical spine *** pain *** posterior cervical tenderness. *** tenderness in bilateral trapezial region.   *** ROM of lumbar spine *** pain *** posterior lumbar tenderness.   No abnormal lesions on exposed skin.   Strength: Side Biceps Triceps Deltoid Interossei Grip Wrist Ext. Wrist Flex.  R 5 5 5 5 5 5 5   L 5 5 5 5 5 5 5    Side Iliopsoas Quads Hamstring PF DF EHL  R 5 5 5 5 5 5   L 5 5 5 5 5 5    Reflexes are ***2+ and symmetric at the biceps, brachioradialis, patella and achilles.   Hoffman's is absent.  Clonus is not present.   Bilateral upper and lower extremity sensation is intact to light touch.     Gait is normal.   ***No difficulty with tandem gait.    Medical Decision Making  Imaging: No recent lumbar imaging.   Assessment and Plan: Ms. [name] is a pleasant 44 y.o. female has ***  Treatment options discussed with patient and following plan made:   - Order for physical therapy for *** spine ***. Patient to call to schedule appointment. *** - Continue current medications including ***. Reviewed dosing and side effects.  - Prescription for ***. Reviewed dosing and side effects. Take with food.  - Prescription for *** to take prn muscle spasms. Reviewed dosing and side effects. Discussed this can cause drowsiness.  - MRI of *** to further evaluate *** radiculopathy. No improvement time or medications (***).  - Referral to PMR at [facility] to discuss possible *** injections.  - Will schedule phone visit to review MRI results once I get them back.   I spent a total of *** minutes in face-to-face and non-face-to-face activities related to this patient's care today including review of outside records, review of imaging, review of symptoms, physical exam, discussion of differential  diagnosis, discussion of treatment options, and documentation.   Thank you for involving me in the care of this patient.   Glade Boys PA-C Dept. of Neurosurgery      [1]  Social History Tobacco Use   Smoking status: Former    Current packs/day: 0.00    Average packs/day: 0.5 packs/day for 20.0 years (10.0 ttl pk-yrs)    Types: Cigarettes    Start date: 2002    Quit date: 2022    Years since quitting: 4.0   Smokeless tobacco: Former   Tobacco comments:    Stopped vaping approximately 4 months back  Vaping Use   Vaping status: Every Day   Substances: Flavoring  Substance Use Topics   Alcohol use: Not Currently   Drug use: Not Currently    Types: Marijuana   "

## 2024-05-07 ENCOUNTER — Telehealth: Payer: Self-pay | Admitting: Cardiovascular Disease

## 2024-05-07 NOTE — Telephone Encounter (Signed)
 S/w the pt and she reports that she has had increased shortness of breath over the past 2 months. It has gotten worse lately. The inhalers don't help, she feels like they make it worse because it makes her heart race. She was given Lorazepam  to take once a day if needed for the shortness of breath. She reports that when she starts to feel short of breath she starts to get nervous/panic and it makes the sob worse. She does not use it every day. She also states that laying certain ways also makes her sob worse. She would like an appt to f/u on her shortness of breath. Wondering if maybe she could wear a heart monitor for a few weeks or so to see if any of the shortness of breath episodes have any correlation to her heart rate?  She has gone to a Pulmonologist and they cannot find anything with the lungs. She has a f/u appt on 05/20/24 with them.   Does not feel any dizziness, sweating, nausea, or any fluttering in her chest. Denies any chest pain at this time. Appt made for 05/15/24 with Dr Court. Given ER precautions. She verbalized understanding.

## 2024-05-07 NOTE — Telephone Encounter (Signed)
" °  Pt c/o Shortness Of Breath: STAT if SOB developed within the last 24 hours or pt is noticeably SOB on the phone  1. Are you currently SOB (can you hear that pt is SOB on the phone)? No   2. How long have you been experiencing SOB? Last two months off and on  3. Are you SOB when sitting or when up moving around? both  4. Are you currently experiencing any other symptoms? Sometime she feels pain on her left arm   "

## 2024-05-08 ENCOUNTER — Other Ambulatory Visit: Payer: Self-pay | Admitting: Psychiatry

## 2024-05-08 DIAGNOSIS — F331 Major depressive disorder, recurrent, moderate: Secondary | ICD-10-CM

## 2024-05-08 DIAGNOSIS — F411 Generalized anxiety disorder: Secondary | ICD-10-CM

## 2024-05-09 ENCOUNTER — Ambulatory Visit: Admitting: Orthopedic Surgery

## 2024-05-10 DIAGNOSIS — G4700 Insomnia, unspecified: Secondary | ICD-10-CM

## 2024-05-10 DIAGNOSIS — F331 Major depressive disorder, recurrent, moderate: Secondary | ICD-10-CM

## 2024-05-10 DIAGNOSIS — F411 Generalized anxiety disorder: Secondary | ICD-10-CM

## 2024-05-10 MED ORDER — MIRTAZAPINE 7.5 MG PO TABS: 15.0000 mg | ORAL_TABLET | Freq: Every day | ORAL | Status: AC

## 2024-05-10 MED ORDER — RAMELTEON 8 MG PO TABS: 8.0000 mg | ORAL_TABLET | Freq: Every day | ORAL | 0 refills | Status: DC

## 2024-05-10 NOTE — Telephone Encounter (Signed)
 Please let patient know I agree with her taking mirtazapine  if she is interested in trial.  Please however let her know mirtazapine  is a medication that builds up in her system and treats her anxiety and depression and not something that works as soon as she takes it.  So hence it is not an as needed medication for anxiety or depression.  Sedation, drowsiness can be a side effect of mirtazapine  and hence it is advised to take it at the end of the day.  However if she is interested she could try it at the time that she chooses to and monitor her symptoms closely and go from there.  She does have upcoming appointment in the next few days.  If we have any other cancellations or slots available she will be contacted for a sooner appointment.  We can discuss further medication management at that visit.  However in the meantime if she wants to try something else for sleep I would recommend Ramelteon  ( Rozerem ).  It is a medication just prescribed for sleep.

## 2024-05-10 NOTE — Telephone Encounter (Signed)
 Attempted to call pt LVM to call back our office.

## 2024-05-13 ENCOUNTER — Ambulatory Visit: Admitting: Psychiatry

## 2024-05-15 ENCOUNTER — Ambulatory Visit: Admitting: Cardiovascular Disease

## 2024-05-16 ENCOUNTER — Ambulatory Visit (INDEPENDENT_AMBULATORY_CARE_PROVIDER_SITE_OTHER): Admitting: Psychiatry

## 2024-05-16 ENCOUNTER — Other Ambulatory Visit: Payer: Self-pay

## 2024-05-16 ENCOUNTER — Encounter: Payer: Self-pay | Admitting: Psychiatry

## 2024-05-16 VITALS — BP 114/82 | HR 99 | Temp 97.8°F | Ht 67.0 in | Wt 234.0 lb

## 2024-05-16 DIAGNOSIS — F331 Major depressive disorder, recurrent, moderate: Secondary | ICD-10-CM

## 2024-05-16 DIAGNOSIS — G4700 Insomnia, unspecified: Secondary | ICD-10-CM | POA: Diagnosis not present

## 2024-05-16 DIAGNOSIS — F411 Generalized anxiety disorder: Secondary | ICD-10-CM

## 2024-05-16 MED ORDER — LORAZEPAM 0.5 MG PO TABS: 0.5000 mg | ORAL_TABLET | Freq: Every day | ORAL | 1 refills | Status: AC | PRN

## 2024-05-16 NOTE — Progress Notes (Signed)
 [facility] MD OP Progress Note  05/16/2024 3:32 PM Melissa Black  MRN:  [id number]  Chief Complaint:  Chief Complaint  Patient presents with   Follow-up   Anxiety   Depression   Insomnia   Medication Refill   Discussed the use of AI scribe software for clinical note transcription with the patient, who gave verbal consent to proceed.  History of Present Illness Melissa Black is a 44 year old African-American female, lives in [place], divorced, currently on Social Security disability, has a history of depression, anxiety, insomnia was evaluated in office today for a follow-up appointment.  She continues to experience ongoing symptoms of depression and anxiety, describing feeling jittery, nervous, and easily irritated. She notes that everything feels overwhelming and expresses worry that something bad will happen. She states that previous medications did not effectively address these symptoms.  She however started taking mirtazapine  recently during the day.  Since starting the mirtazapine  it has been beneficial for her anxiety and she does not feel as jittery as she used to before.  She reports she is tolerating the mirtazapine  in the morning and it does not make her drowsy or sleepy.  She just started taking the mirtazapine  few days ago.  She is aware she needs to give it more time.   She continues to have persistent sleep difficulties, including fragmented sleep, frequent awakenings, and difficulty maintaining sleep. She typically goes to bed between 8 and 10 PM, sleeps for 1 to 2 hours at a time, and wakes up multiple times during the night. She usually rises between 5 and 6 AM and identifies as an early morning person. Since switching mirtazapine  to daytime dosing, she reports improvement in nightmares, but her sleep remains unsteady. She picked up the new sleep medication Rozerem  today.  She has not tried it yet.  She does not have a good sleep hygiene at this time.  She reports she  watches TV or scrolls on her phone prior to falling asleep or when sleep is interrupted.  She also does not have a set bedtime or wake up time.  Her daughter has observed snoring and breathing difficulties during sleep, which began after she developed shortness of breath in July. She describes tossing and turning at night and reports that discomfort from her mattress contributes to sleep disruption, noting a history of back surgery in 2022 and 2023. She continues to seek a more comfortable mattress to improve sleep.  She denies any suicidality, homicidality or perceptual disturbances.   Visit Diagnosis:    ICD-10-CM   1. MDD (major depressive disorder), recurrent episode, moderate (HCC)  F33.1     2. GAD (generalized anxiety disorder)  F41.1 LORazepam  (ATIVAN ) 0.5 MG tablet    3. Insomnia, unspecified type  G47.00       Past Psychiatric History: I have reviewed past psychiatric history from progress note from 07/31/2023.  Past trials of medications like amitriptyline , duloxetine , Lexapro , venlafaxine, fluoxetine, mirtazapine , Celexa, Wellbutrin, hydroxyzine , doxepin   Past Medical History:  Past Medical History:  Diagnosis Date   ADHD (attention deficit hyperactivity disorder)    Anemia    h/o   Anxiety    Asthma    Chest pain, unspecified    Genital herpes    History of chlamydia    History of gonorrhea    History of marijuana use    Obesity    SOB (shortness of breath)    Spinal stenosis of lumbar region with radiculopathy     Past  Surgical History:  Procedure Laterality Date   CESAREAN SECTION     x1   FOOT SURGERY Right    broken toe   HEMI-MICRODISCECTOMY LUMBAR LAMINECTOMY LEVEL 1 Right 11/10/2021   Procedure: OPEN RIGHT REDO L5-S1 MICRODISCECTOMY;  Surgeon: Clois Fret, MD;  Location: [facility] ORS;  Service: Neurosurgery;  Laterality: Right;   LUMBAR LAMINECTOMY/DECOMPRESSION MICRODISCECTOMY Right 08/30/2021   Procedure: RIGHT L4-5 LAMINOFORAMINOTOMY, RIGHT L5-S1  MICRODISCECTOMY;  Surgeon: Clois Fret, MD;  Location: [facility] ORS;  Service: Neurosurgery;  Laterality: Right;   THYROID  SURGERY  12/28/2023    Family Psychiatric History: I have reviewed family psychiatric history from progress note on 07/31/2023.  Family History:  Family History  Problem Relation Age of Onset   Depression Mother    ADD / ADHD Son     Social History: I have reviewed social history from progress note on 07/31/2023. Social History   Socioeconomic History   Marital status: Single    Spouse name: Not on file   Number of children: 2   Years of education: Not on file   Highest education level: High school graduate  Occupational History   Not on file  Tobacco Use   Smoking status: Former    Current packs/day: 0.00    Average packs/day: 0.5 packs/day for 20.0 years (10.0 ttl pk-yrs)    Types: Cigarettes    Start date: 2002    Quit date: 2022    Years since quitting: 4.0   Smokeless tobacco: Former   Tobacco comments:    Stopped vaping approximately 4 months back  Vaping Use   Vaping status: Every Day   Substances: Flavoring  Substance and Sexual Activity   Alcohol use: Not Currently   Drug use: Not Currently    Types: Marijuana   Sexual activity: Yes  Other Topics Concern   Not on file  Social History Narrative   Lives with boyfriend and daughter   Social Drivers of Health   Tobacco Use: Medium Risk (05/16/2024)   Patient History    Smoking Tobacco Use: Former    Smokeless Tobacco Use: Former    Passive Exposure: Not on Stage Manager: Not on file  Food Insecurity: No Food Insecurity (12/15/2023)   Received from [facility]   Epic    Within the past 12 months, you worried that your food would run out before you got the money to buy more.: Never true    Within the past 12 months, the food you bought just didn't last and you didn't have money to get more.: Never true  Transportation Needs: No Transportation Needs (12/15/2023)    Received from [facility]   PRAPARE - Transportation    Lack of Transportation (Medical): No    Lack of Transportation (Non-Medical): No  Physical Activity: Not on file  Stress: Not on file  Social Connections: Not on file  Depression (PHQ2-9): High Risk (05/16/2024)   Depression (PHQ2-9)    PHQ-2 Score: 17  Alcohol Screen: Not on file  Housing: Not on file  Utilities: Low Risk (12/15/2023)   Received from [facility]   Utilities    Within the past 12 months, have you been unable to get utilities(heat, electricity) when it was really needed?: No  Health Literacy: Not on file    Allergies: Allergies[1]  Metabolic Disorder Labs: No results found for: HGBA1C, MPG No results found for: PROLACTIN No results found for: CHOL, TRIG, HDL, CHOLHDL, VLDL, LDLCALC Lab Results  Component  Value Date   TSH 2.914 11/24/2023    Therapeutic Level Labs: No results found for: LITHIUM No results found for: VALPROATE No results found for: CBMZ  Current Medications: Current Outpatient Medications  Medication Sig Dispense Refill   medroxyPROGESTERone (DEPO-PROVERA) 150 MG/ML injection Inject into the muscle.     mirtazapine  (REMERON ) 7.5 MG tablet Take 2 tablets (15 mg total) by mouth at bedtime.     Multiple Vitamin (MULTIVITAMIN WITH MINERALS) TABS tablet Take 1 tablet by mouth in the morning. One A Day Multivitamin for Women     ramelteon  (ROZEREM ) 8 MG tablet Take 1 tablet (8 mg total) by mouth at bedtime. 15 tablet 0   albuterol  (VENTOLIN  HFA) 108 (90 Base) MCG/ACT inhaler Inhale 2 puffs into the lungs every 6 (six) hours as needed for wheezing or shortness of breath. 8 g 2   LORazepam  (ATIVAN ) 0.5 MG tablet Take 1 tablet (0.5 mg total) by mouth daily as needed for anxiety. 10 pills must last 30 days 10 tablet 1   No current facility-administered medications for this visit.     Musculoskeletal: Strength & Muscle Tone: within normal limits Gait & Station:  normal Patient leans: N/A  Psychiatric Specialty Exam: Review of Systems  Psychiatric/Behavioral:  Positive for dysphoric mood and sleep disturbance. The patient is nervous/anxious.     Blood pressure 114/82, pulse 99, temperature 97.8 F (36.6 C), temperature source Temporal, height 5' 7 (1.702 m), weight 234 lb (106.1 kg).Body mass index is 36.65 kg/m.  General Appearance: Casual  Eye Contact:  Fair  Speech:  Clear and Coherent  Volume:  Normal  Mood:  Anxious and Depressed  Affect:  Appropriate  Thought Process:  Goal Directed and Descriptions of Associations: Intact  Orientation:  Full (Time, Place, and Person)  Thought Content: Logical   Suicidal Thoughts:  No  Homicidal Thoughts:  No  Memory:  Immediate;   Fair Recent;   Fair Remote;   Fair  Judgement:  Fair  Insight:  Fair  Psychomotor Activity:  Normal  Concentration:  Concentration: Fair and Attention Span: Fair  Recall:  [employer] of Knowledge: Fair  Language: Fair  Akathisia:  No  Handed:  Right  AIMS (if indicated): not done  Assets:  Communication Skills Desire for Improvement Housing Social Support  ADL's:  Intact  Cognition: WNL  Sleep:  Poor   Screenings: GAD-7    Loss Adjuster, Chartered Office Visit from 05/16/2024 in [facility] Office Visit from 04/04/2024 in [facility] Office Visit from 03/05/2024 in [facility] Office Visit from 07[facility]31/2023 in [facility]  Total GAD-7 Score 15 4 5 15    PHQ2-9    Flowsheet Row Office Visit from 05/16/2024 in [facility] Video Visit from 04/16/2024 in [facility] Office Visit from 04/04/2024 in [facility] Office Visit from 03/05/2024 in [facility]  Office Visit from 01/03/2024 in [facility]  PHQ-2 Total Score 6 5 2 2 2   PHQ-9 Total Score 17 12 9 7 6    Flowsheet Row Office Visit from 05/16/2024 in [facility] Video Visit from 04/16/2024 in [facility] Office Visit from 04/04/2024 in [facility]  C-SSRS RISK CATEGORY No Risk No Risk No Risk  Assessment and Plan: Melissa Black is a 44 year old African-American female who presented for a follow-up appointment, discussed assessment and plan as noted below.  1. MDD (major depressive disorder), recurrent episode, moderate (HCC)-improving Currently reports mood symptoms is improving since initiation of mirtazapine  during the day and denies any drowsiness or sedation during the day.  Patient agreeable to monitor symptoms closely.  Patient encouraged to continue psychotherapy sessions.  Patient to sign an ROI to coordinate care with current therapist. Continue Mirtazapine  15 mg daily  2. GAD (generalized anxiety disorder)-improving Currently reports anxiety symptoms is improved and motivated to stay in therapy Continue Lorazepam  0.5 mg daily as needed Patient to limit use.  Aware of long-term risk of benzodiazepine therapy Continue Mirtazapine  15 mg daily Continue CBT Reviewed [place] PMP AWARxE   3. Insomnia, unspecified type-unstable Ongoing sleep problems was able to pick up ramelteon  which was recently started just today from the pharmacy.  Some time was spent discussing sleep hygiene techniques including set bedtime and wake up time, avoiding electronic devices including watching TV or browsing on phone prior to bedtime, winding down, coming up with relaxation strategies and rituals before bedtime, keeping the window shades of dark to avoid lying from coming into the room, keeping the thermostat as a comfortable setting,  comfortable mattress, wearing comfortable clothes, avoiding alcohol caffeine and being married at the end of the day.  Patient provided information printed out to keep a sleep log and follow sleep hygiene. Start Ramelteon  8 mg at bedtime Will consider referral for sleep study in the future if needed.  Follow-up Follow-up in clinic in 1 week or sooner in person.    Collaboration of Care: Collaboration of Care: Other -  encouraged to sign an ROI to coordinate care with current therapist.  Patient/Guardian was advised Release of Information must be obtained prior to any record release in order to collaborate their care with an outside provider. Patient/Guardian was advised if they have not already done so to contact the registration department to sign all necessary forms in order for us  to release information regarding their care.   Consent: Patient/Guardian gives verbal consent for treatment and assignment of benefits for services provided during this visit. Patient/Guardian expressed understanding and agreed to proceed.   This note was generated in part or whole with voice recognition software. Voice recognition is usually quite accurate but there are transcription errors that can and very often do occur. I apologize for any typographical errors that were not detected and corrected.    Jaicion Laurie, MD 05/17/2024, 7:31 AM     [1]  Allergies Allergen Reactions   Codeine Swelling   Penicillins Swelling and Other (See Comments)    TOLERATED CEFAZOLIN  Has patient had a PCN reaction causing immediate rash, facial/tongue/throat swelling, SOB or lightheadedness with hypotension: yes Has patient had a PCN reaction causing severe rash involving mucus membranes or skin necrosis: no Has patient had a PCN reaction that required hospitalization no Has patient had a PCN reaction occurring within the last 10 years: no If all of the above answers are NO, then may proceed with Cephalosporin use.     Benadryl [Diphenhydramine] Hives   Bupropion Palpitations    Heart palpitations.

## 2024-05-16 NOTE — Patient Instructions (Signed)
 Insomnia Insomnia is a sleep disorder that makes it difficult to fall asleep or stay asleep. Insomnia can cause fatigue, low energy, difficulty concentrating, mood swings, and poor performance at work or school. There are three different ways to classify insomnia: Difficulty falling asleep. Difficulty staying asleep. Waking up too early in the morning. Any type of insomnia can be long-term (chronic) or short-term (acute). Both are common. Short-term insomnia usually lasts for 3 months or less. Chronic insomnia occurs at least three times a week for longer than 3 months. What are the causes? Insomnia may be caused by another condition, situation, or substance, such as: Having certain mental health conditions, such as anxiety and depression. Using caffeine, alcohol , tobacco, or drugs. Having gastrointestinal conditions, such as gastroesophageal reflux disease (GERD). Having certain medical conditions. These include: Asthma. Alzheimer's disease. Stroke. Chronic pain. An overactive thyroid  gland (hyperthyroidism). Other sleep disorders, such as restless legs syndrome and sleep apnea. Menopause. Sometimes, the cause of insomnia may not be known. What increases the risk? Risk factors for insomnia include: Gender. Females are affected more often than males. Age. Insomnia is more common as people get older. Stress and certain medical and mental health conditions. Lack of exercise. Having an irregular work schedule. This may include working night shifts and traveling between different time zones. What are the signs or symptoms? If you have insomnia, the main symptom is having trouble falling asleep or having trouble staying asleep. This may lead to other symptoms, such as: Feeling tired or having low energy. Feeling nervous about going to sleep. Not feeling rested in the morning. Having trouble concentrating. Feeling irritable, anxious, or depressed. How is this diagnosed? This condition  may be diagnosed based on: Your symptoms and medical history. Your health care provider may ask about: Your sleep habits. Any medical conditions you have. Your mental health. A physical exam. How is this treated? Treatment for insomnia depends on the cause. Treatment may focus on treating an underlying condition that is causing the insomnia. Treatment may also include: Medicines to help you sleep. Counseling or therapy. Lifestyle adjustments to help you sleep better. Follow these instructions at home: Eating and drinking  Limit or avoid alcohol , caffeinated beverages, and products that contain nicotine and tobacco, especially close to bedtime. These can disrupt your sleep. Do not eat a large meal or eat spicy foods right before bedtime. This can lead to digestive discomfort that can make it hard for you to sleep. Sleep habits  Keep a sleep diary to help you and your health care provider figure out what could be causing your insomnia. Write down: When you sleep. When you wake up during the night. How well you sleep and how rested you feel the next day. Any side effects of medicines you are taking. What you eat and drink. Make your bedroom a dark, comfortable place where it is easy to fall asleep. Put up shades or blackout curtains to block light from outside. Use a white noise machine to block noise. Keep the temperature cool. Limit screen use before bedtime. This includes: Not watching TV. Not using your smartphone, tablet, or computer. Stick to a routine that includes going to bed and waking up at the same times every day and night. This can help you fall asleep faster. Consider making a quiet activity, such as reading, part of your nighttime routine. Try to avoid taking naps during the day so that you sleep better at night. Get out of bed if you are still awake after  15 minutes of trying to sleep. Keep the lights down, but try reading or doing a quiet activity. When you feel  sleepy, go back to bed. General instructions Take over-the-counter and prescription medicines only as told by your health care provider. Exercise regularly as told by your health care provider. However, avoid exercising in the hours right before bedtime. Use relaxation techniques to manage stress. Ask your health care provider to suggest some techniques that may work well for you. These may include: Breathing exercises. Routines to release muscle tension. Visualizing peaceful scenes. Make sure that you drive carefully. Do not drive if you feel very sleepy. Keep all follow-up visits. This is important. Contact a health care provider if: You are tired throughout the day. You have trouble in your daily routine due to sleepiness. You continue to have sleep problems, or your sleep problems get worse. Get help right away if: You have thoughts about hurting yourself or someone else. Get help right away if you feel like you may hurt yourself or others, or have thoughts about taking your own life. Go to your nearest emergency room or: Call 911. Call the National Suicide Prevention Lifeline at 2232757840 or 988. This is open 24 hours a day. Text the Crisis Text Line at 657-529-4371. Summary Insomnia is a sleep disorder that makes it difficult to fall asleep or stay asleep. Insomnia can be long-term (chronic) or short-term (acute). Treatment for insomnia depends on the cause. Treatment may focus on treating an underlying condition that is causing the insomnia. Keep a sleep diary to help you and your health care provider figure out what could be causing your insomnia. This information is not intended to replace advice given to you by your health care provider. Make sure you discuss any questions you have with your health care provider. Document Revised: 03/22/2021 Document Reviewed: 03/22/2021 Elsevier Patient Education  2024 [employer].

## 2024-05-17 ENCOUNTER — Encounter: Payer: Self-pay | Admitting: Cardiovascular Disease

## 2024-05-20 ENCOUNTER — Ambulatory Visit (HOSPITAL_BASED_OUTPATIENT_CLINIC_OR_DEPARTMENT_OTHER): Admitting: Pulmonary Disease

## 2024-05-22 ENCOUNTER — Ambulatory Visit (HOSPITAL_BASED_OUTPATIENT_CLINIC_OR_DEPARTMENT_OTHER)

## 2024-05-22 ENCOUNTER — Encounter (HOSPITAL_BASED_OUTPATIENT_CLINIC_OR_DEPARTMENT_OTHER): Payer: Self-pay

## 2024-05-22 VITALS — BP 140/79 | HR 98 | Ht 67.0 in | Wt 235.8 lb

## 2024-05-22 DIAGNOSIS — F411 Generalized anxiety disorder: Secondary | ICD-10-CM

## 2024-05-22 DIAGNOSIS — J432 Centrilobular emphysema: Secondary | ICD-10-CM

## 2024-05-22 DIAGNOSIS — R0789 Other chest pain: Secondary | ICD-10-CM | POA: Diagnosis not present

## 2024-05-22 DIAGNOSIS — R0609 Other forms of dyspnea: Secondary | ICD-10-CM

## 2024-05-22 DIAGNOSIS — G4719 Other hypersomnia: Secondary | ICD-10-CM

## 2024-05-22 DIAGNOSIS — R5383 Other fatigue: Secondary | ICD-10-CM

## 2024-05-22 DIAGNOSIS — G4730 Sleep apnea, unspecified: Secondary | ICD-10-CM

## 2024-05-22 MED ORDER — LEVALBUTEROL TARTRATE 45 MCG/ACT IN AERO: 2.0000 | INHALATION_SPRAY | RESPIRATORY_TRACT | Status: AC | PRN

## 2024-05-22 NOTE — Progress Notes (Signed)
 "  @Patient  ID: Melissa Black, female    DOB: May 29, 1980, 44 y.o.   MRN: [id number]  Chief Complaint  Patient presents with   Shortness of Breath    Referring provider: [facility]  HPI: Discussed the use of AI scribe software for clinical note transcription with the patient, who gave verbal consent to proceed.  History of Present Illness Melissa Black is a 44 year old female with PMH of centrilobular emphysema and DOE who presents for follow up.  She reports ongoing shortness of breath but has been worried to try her Albuterol  as she is worried that she will feel anxious based on how she has responded to other inhalers before.  She has been experiencing significant shortness of breath for the past two weeks, which has worsened compared to previous episodes. She becomes out of breath with minimal exertion, such as walking short distances. Despite her oxygen levels being reported as 100%, she feels this episode is different from past experiences.  Occasional chest pain accompanies the shortness of breath, sometimes radiating to her left arm and leg. Aspirin alleviates the chest pain. She describes the sensation as feeling like she is 'constantly running on a treadmill' and notes that her breathing is noticeably loud, as observed by her daughter and husband.  She has a history of shortness of breath and has undergone various diagnostic tests, including an echocardiogram and pulmonary function tests. She has been evaluated for blood clots and anemia. All of which have been normal.  She has tried medications such as albuterol , Spiriva , and Stiolto, but experienced side effects like jitteriness and worsened breathing, leading her to discontinue them.  Persistent soreness in her left arm and leg becomes severe during episodes of chest pain. The soreness has been present for about five months and does not resolve completely, impacting her ability to perform daily  activities like dressing and showering. She has had three episodes of severe pain in the last five months, with the most recent occurring two weeks ago.  She reports poor sleep quality over the past three months, with difficulty staying asleep and feeling tired during the day. She snores, and her daughter has noticed an increase in snoring since her breathing issues began. She is currently on Depo-Provera, receiving it every three months, and has been prescribed Rozerem  8 mg for sleep, which she has not yet started due to concerns about her breathing issues.  No racing heart or palpitations associated with the shortness of breath. Loud breathing noticed by her family and poor sleep quality, with frequent awakenings at night.  Last OV 01/23/2024: Melissa Black is a 44 y.o. female with PMH significant for GAD, MDD, hyperhidrosis, goiter now s/p R hemithyroidectomy who presents for follow up of exertional dyspnea.   Last seen 11/2023, at the time complained of dyspnea. Referred to cardiology who did not recommend any type of ischemic evaluation. I ordered echocardiogram, appt scheduled 10/22. Upgraded Spiriva  to Stiolto.    Today, the patient notes feeling nervous with Stiolto and reports that it makes her more short of breath. This effect has been reported by her with albuterol  MDI and Spiriva  previously. She notes prednisone  did not help with her symptoms. Dyspnea started after 11/17/23 when she sprayed house for spiders. Previously, she did not have much dyspnea. She notes that she does not have coughing or wheezing. She went to ED at the time and had quite an extensive work up including CTA  chest that did not show PE. She was treated for bronchitis with prednisone , bronchodilators, and antimicrobial without much improvement. She notes that dyspnea is episodic and can occur at rest, with exertion, and sometimes wakes her up at night. She starts panicking when that happens. She takes lorazepam  and that  calms her down and improves dyspnea. She does see psychiatry who started her on Mirtazapine  recently and took her off amitryptiline. She has not done that switch yet. In the interim, she had a hemithyroidectomy that improved her dysphagia symptoms but did not drastically affect her breathing. She notes that dyspnea is worse with eating and drinking but recently had an EGD with GI that reportedly did not show any issues. As far as sleep apnea symptoms, the patient reports snoring, frequent night time awakening, fatigue, frequent napping (at least once or twice per day). ESS is 7. STOP BANG is Goes to bed at 9 PM. Sometimes wakes up. Inhalers makes things worse. Tired most days. Takes naps during day (1-2 times per day, 3 times per week). ESS 7. STOP BANG is 2.   TEST/EVENTS : ESS 7.  ECHO with EF 55-60%  Allergies[1]  Immunization History  Administered Date(s) Administered   Influenza, Seasonal, Injecte, Preservative Fre 01/23/2024   Moderna Sars-Covid-2 Vaccination 07/31/2019, 08/28/2019    Past Medical History:  Diagnosis Date   ADHD (attention deficit hyperactivity disorder)    Anemia    h/o   Anxiety    Asthma    Chest pain, unspecified    Genital herpes    History of chlamydia    History of gonorrhea    History of marijuana use    Obesity    SOB (shortness of breath)    Spinal stenosis of lumbar region with radiculopathy     Tobacco History: Tobacco Use History[2] Counseling given: Not Answered Tobacco comments: Stopped vaping approximately 4 months back   Outpatient Medications Prior to Visit  Medication Sig Dispense Refill   LORazepam  (ATIVAN ) 0.5 MG tablet Take 1 tablet (0.5 mg total) by mouth daily as needed for anxiety. 10 pills must last 30 days 10 tablet 1   medroxyPROGESTERone (DEPO-PROVERA) 150 MG/ML injection Inject into the muscle.     mirtazapine  (REMERON ) 7.5 MG tablet Take 2 tablets (15 mg total) by mouth at bedtime.     Multiple Vitamin (MULTIVITAMIN WITH  MINERALS) TABS tablet Take 1 tablet by mouth in the morning. One A Day Multivitamin for Women     ramelteon  (ROZEREM ) 8 MG tablet Take 1 tablet (8 mg total) by mouth at bedtime. 15 tablet 0   albuterol  (VENTOLIN  HFA) 108 (90 Base) MCG/ACT inhaler Inhale 2 puffs into the lungs every 6 (six) hours as needed for wheezing or shortness of breath. 8 g 2   No facility-administered medications prior to visit.     Review of Systems: as per hpi  Constitutional:   No  weight loss, night sweats,  Fevers, chills, fatigue, or  lassitude.  HEENT:   No headaches,  Difficulty swallowing,  Tooth/dental problems, or  Sore throat,                No sneezing, itching, ear ache, nasal congestion, post nasal drip,   CV:  No chest pain,  Orthopnea, PND, swelling in lower extremities, anasarca, dizziness, palpitations, syncope.   GI  No heartburn, indigestion, abdominal pain, nausea, vomiting, diarrhea, change in bowel habits, loss of appetite, bloody stools.   Resp: No shortness of breath with exertion or at  rest.  No excess mucus, no productive cough,  No non-productive cough,  No coughing up of blood.  No change in color of mucus.  No wheezing.  No chest wall deformity  Skin: no rash or lesions.  GU: no dysuria, change in color of urine, no urgency or frequency.  No flank pain, no hematuria   MS:  No joint pain or swelling.  No decreased range of motion.  No back pain.    Physical Exam  BP (!) 140/79   Pulse 98   Ht 5' 7 (1.702 m)   Wt 235 lb 12.8 oz (107 kg)   SpO2 100%   BMI 36.93 kg/m   GEN: A/Ox3; pleasant , NAD, well nourished.  Speaks in full sentences.   HEENT:  /AT,  EACs-clear, TMs-wnl, NOSE-clear, THROAT-clear, no lesions, no postnasal drip or exudate noted. Mallampati 3  NECK:  Supple w/ fair ROM; no JVD; normal carotid impulses w/o bruits; no thyromegaly or nodules palpated; no lymphadenopathy.    RESP  Clear  P & A; w/o, wheezes/ rales/ or rhonchi. no accessory muscle use, no  dullness to percussion  CARD:  RRR, no m/r/g, no peripheral edema, pulses intact, no cyanosis or clubbing.  GI:   Soft & nt; nml bowel sounds; no organomegaly or masses detected.   Musco: Warm bil, no deformities or joint swelling noted.   Neuro: alert, no focal deficits noted.    Skin: Warm, no lesions or rashes    Lab Results:  CBC    Component Value Date/Time   WBC 10.1 11/24/2023 1934   RBC 4.04 11/24/2023 1934   HGB 12.4 11/24/2023 1934   HGB 13.0 07/26/2012 1134   HCT 36.8 11/24/2023 1934   HCT 39.0 07/26/2012 1134   PLT 284 11/24/2023 1934   PLT 207 07/26/2012 1134   MCV 91.1 11/24/2023 1934   MCV 94 07/26/2012 1134   MCH 30.7 11/24/2023 1934   MCHC 33.7 11/24/2023 1934   RDW 12.5 11/24/2023 1934   RDW 12.9 07/26/2012 1134   LYMPHSABS 3.6 11/24/2023 1934   LYMPHSABS 2.8 07/26/2012 1134   MONOABS 1.2 (H) 11/24/2023 1934   MONOABS 0.8 07/26/2012 1134   EOSABS 0.1 11/24/2023 1934   EOSABS 0.2 07/26/2012 1134   BASOSABS 0.1 11/24/2023 1934   BASOSABS 0.1 07/26/2012 1134    BMET    Component Value Date/Time   NA 140 11/24/2023 1934   NA 143 11/16/2011 2218   K 3.1 (L) 11/24/2023 1934   K 3.9 07/26/2012 1134   CL 110 11/24/2023 1934   CL 112 (H) 11/16/2011 2218   CO2 22 11/24/2023 1934   CO2 24 11/16/2011 2218   GLUCOSE 100 (H) 11/24/2023 1934   GLUCOSE 118 (H) 11/16/2011 2218   BUN 15 11/24/2023 1934   BUN 27 (H) 11/16/2011 2218   CREATININE 0.78 11/24/2023 1934   CREATININE 1.17 11/16/2011 2218   CALCIUM 8.6 (L) 11/24/2023 1934   CALCIUM 8.7 11/16/2011 2218   GFRNONAA >60 11/24/2023 1934   GFRNONAA >60 11/16/2011 2218   GFRAA >60 05/26/2016 2221   GFRAA >60 11/16/2011 2218    BNP    Component Value Date/Time   BNP 13.0 09/15/2015 0603    ProBNP No results found for: PROBNP  Imaging: No results found.  Administration History     None          Latest Ref Rng & Units 12/04/2023    2:51 PM  PFT Results  FVC-Pre L 3.44  FVC-Predicted Pre % 85   Pre FEV1/FVC % % 79   FEV1-Pre L 2.71   FEV1-Predicted Pre % 83   DLCO uncorrected ml/min/mmHg 21.12   DLCO UNC% % 88   DLVA Predicted % 111     No results found for: NITRICOXIDE   Assessment & Plan:  Geisha Abernathy is a 44 year old female with PMH of centrilobular emphysema and DOE who presents for follow up.  She reports ongoing shortness of breath but has been worried to try her Albuterol  as she is worried that she will feel anxious based on how she has responded to other inhalers before.  Workup has been inclusive to r/o PE, anemia, GI reflux, cardiac causes- all have been negative thus far. Assessment & Plan Excessive daytime sleepiness  Centrilobular emphysema (HCC)  Dyspnea on exertion  Sleep-disordered breathing  Atypical chest pain  GAD (generalized anxiety disorder)  Assessment and Plan Assessment & Plan Suspected obstructive sleep apnea Chronic dyspnea and fatigue with poor sleep quality, snoring, and daytime fatigue. Differential includes obstructive sleep apnea, potentially contributing to dyspnea and fatigue. Previous cardiac and pulmonary evaluations were normal. Sleep apnea can cause increased cardiac stress, leading to fatigue and dyspnea. - Ordered home sleep test to evaluate for obstructive sleep apnea. - Advised to delay starting Rozerem  until sleep apnea is ruled out due to potential respiratory depression.  Chronic dyspnea Persisting for two weeks, exacerbated by minimal exertion. Previous medications caused jitteriness and worsened symptoms. Current symptoms include constant dyspnea, fatigue, and intermittent chest pain. Previous cardiac and pulmonary evaluations were normal. Differential includes obstructive sleep apnea as a contributing factor. - Prescribed Xopenex  as a rescue inhaler to manage dyspnea as it has fewer side effects. - Instructed to take two puffs of the inhaler every six hours as needed. - Ordered home  sleep test to evaluate for obstructive sleep apnea.    Return in about 6 weeks (around 07/03/2024) for sleep study review.  Candis Dandy, PA-C 05/22/2024      [1]  Allergies Allergen Reactions   Codeine Swelling   Penicillins Swelling and Other (See Comments)    TOLERATED CEFAZOLIN  Has patient had a PCN reaction causing immediate rash, facial/tongue/throat swelling, SOB or lightheadedness with hypotension: yes Has patient had a PCN reaction causing severe rash involving mucus membranes or skin necrosis: no Has patient had a PCN reaction that required hospitalization no Has patient had a PCN reaction occurring within the last 10 years: no If all of the above answers are NO, then may proceed with Cephalosporin use.    Benadryl [Diphenhydramine] Hives   Bupropion Palpitations    Heart palpitations.  [2]  Social History Tobacco Use  Smoking Status Former   Current packs/day: 0.00   Average packs/day: 0.5 packs/day for 20.0 years (10.0 ttl pk-yrs)   Types: Cigarettes   Start date: 2002   Quit date: 2022   Years since quitting: 4.0  Smokeless Tobacco Former  Tobacco Comments   Stopped vaping approximately 4 months back   "

## 2024-05-22 NOTE — Patient Instructions (Addendum)
 Complete home sleep test as ordered.  Follow up in 6 weeks with Dr. Catherine or APP for sleep test results followup.  Stop Albuterol .  May use Xopenex  2 puffs inhaled every 6 hours as needed for shortness of breath.  Hold Rozerem  until sleep test completed.

## 2024-05-23 ENCOUNTER — Other Ambulatory Visit: Payer: Self-pay | Admitting: Psychiatry

## 2024-05-23 DIAGNOSIS — G4700 Insomnia, unspecified: Secondary | ICD-10-CM

## 2024-05-28 ENCOUNTER — Ambulatory Visit: Admitting: Psychiatry

## 2024-05-29 ENCOUNTER — Encounter (HOSPITAL_BASED_OUTPATIENT_CLINIC_OR_DEPARTMENT_OTHER)

## 2024-05-30 ENCOUNTER — Ambulatory Visit: Admitting: Psychiatry

## 2024-05-31 ENCOUNTER — Telehealth: Payer: Self-pay

## 2024-05-31 NOTE — Telephone Encounter (Signed)
 HST Denied! Request received for Sleep Study code [id number] - 1 unit for dates of service 05/24/2024 to 07/23/2024. [place] [employer]HS Clinical Coverage Policy No. [id number] was used to review the request. DETERMINATION: Denied CLAIM HISTORY: The member is an adult female (DOB: April 09, 1981) who went to the doctor for concern of trouble breathing, called dyspnea. The member reported nighttime awakening, fatigue, and snoring. On 11/30/2023 her body mass index (BMI) was 29.76, (BMI is a number that shows if someone is a healthy weight for their height). An unattended home sleep study was ordered to check for suspect obstructive sleep apnea (OSA, a condition where breathing stops briefly during sleep). Clinical Rationale for Determination: On 01/23/2024, the member said she was snoring, waking up often during the night, felt very tired during the day, and needed to nap frequently. Her Epworth sleepiness score was 7. (The Epworth sleepiness score measures how sleepy someone feels during the day; a low score means normal sleepiness.) Her Stop Bang score was 2. (Stop Wynona is a tool used to estimate the chance of having sleep apnea.) At that time, her BMI was 31.6. By 05/22/2024, she reported that her sleep quality was poor. Her daughter also noticed that her snoring had gotten louder. Her BMI had increased to 37. The guidelines say that to strongly suspect obstructive sleep apnea (OSA, a condition where breathing stops briefly during sleep). A person needs to have at least four of the following signs: 1. regular snoring 2.observed pauses in breathing 3. waking up choking or gasping for air 4. headaches in the morning 5. feeling very sleepy during the day 6. A BMI higher than 35. In this case, she only meets one of these signs clearly, which is having a BMI over 35. Although she does report snoring, it was not described as happening regularly. There was no information about someone seeing her stop breathing, waking up choking or gasping, or  having morning headaches. Her daytime sleepiness score was also normal at 7. Based on the member's information and [place] Medicaid Medicaid Clinical Coverage Policy No. [id number]: Sleep Studies and Polysomnography Services - 3.2 Specific Criteria Covered - 3.2.1 Specific criteria covered by Medicaid - b, the requested home sleep study is not considered medically necessary according to the guidelines. Therefore, the request is Denied.

## 2024-06-10 ENCOUNTER — Encounter (HOSPITAL_BASED_OUTPATIENT_CLINIC_OR_DEPARTMENT_OTHER)

## 2024-06-13 ENCOUNTER — Ambulatory Visit: Admitting: Psychiatry

## 2024-07-08 ENCOUNTER — Ambulatory Visit (HOSPITAL_BASED_OUTPATIENT_CLINIC_OR_DEPARTMENT_OTHER)
